# Patient Record
Sex: Female | Born: 1975 | Race: Black or African American | Hispanic: No | Marital: Married | Smoking: Former smoker
Health system: Southern US, Community
[De-identification: ages and names within clinical notes are randomized; demographics above are authoritative.]

## PROBLEM LIST (undated history)

## (undated) ENCOUNTER — Inpatient Hospital Stay (HOSPITAL_COMMUNITY): Payer: Self-pay

## (undated) DIAGNOSIS — E669 Obesity, unspecified: Secondary | ICD-10-CM

## (undated) DIAGNOSIS — M519 Unspecified thoracic, thoracolumbar and lumbosacral intervertebral disc disorder: Secondary | ICD-10-CM

## (undated) DIAGNOSIS — M1990 Unspecified osteoarthritis, unspecified site: Secondary | ICD-10-CM

## (undated) DIAGNOSIS — R569 Unspecified convulsions: Secondary | ICD-10-CM

## (undated) DIAGNOSIS — I10 Essential (primary) hypertension: Secondary | ICD-10-CM

## (undated) DIAGNOSIS — I480 Paroxysmal atrial fibrillation: Secondary | ICD-10-CM

## (undated) DIAGNOSIS — D219 Benign neoplasm of connective and other soft tissue, unspecified: Secondary | ICD-10-CM

## (undated) DIAGNOSIS — Z87442 Personal history of urinary calculi: Secondary | ICD-10-CM

## (undated) HISTORY — PX: TUBAL LIGATION: SHX77

---

## 2010-01-14 HISTORY — PX: CERVICAL LAMINECTOMY: SHX94

## 2011-10-03 ENCOUNTER — Emergency Department (HOSPITAL_COMMUNITY): Payer: Self-pay

## 2011-10-03 ENCOUNTER — Emergency Department (HOSPITAL_COMMUNITY)
Admission: EM | Admit: 2011-10-03 | Discharge: 2011-10-03 | Disposition: A | Discharge status: Home / Self Care | Payer: Self-pay | Attending: Emergency Medicine | Admitting: Emergency Medicine

## 2011-10-03 DIAGNOSIS — I10 Essential (primary) hypertension: Secondary | ICD-10-CM | POA: Insufficient documentation

## 2011-10-03 DIAGNOSIS — Z79899 Other long term (current) drug therapy: Secondary | ICD-10-CM | POA: Insufficient documentation

## 2011-10-03 DIAGNOSIS — D259 Leiomyoma of uterus, unspecified: Secondary | ICD-10-CM | POA: Insufficient documentation

## 2011-10-03 LAB — URINALYSIS, ROUTINE W REFLEX MICROSCOPIC
Glucose, UA: NEGATIVE mg/dL
Protein, ur: NEGATIVE mg/dL
Urobilinogen, UA: 1 mg/dL (ref 0.0–1.0)

## 2011-10-03 LAB — WET PREP, GENITAL
Trich, Wet Prep: NONE SEEN
WBC, Wet Prep HPF POC: NONE SEEN
Yeast Wet Prep HPF POC: NONE SEEN

## 2011-10-05 LAB — GC/CHLAMYDIA PROBE AMP, GENITAL
Chlamydia, DNA Probe: NEGATIVE
GC Probe Amp, Genital: NEGATIVE

## 2011-11-24 ENCOUNTER — Emergency Department (HOSPITAL_COMMUNITY): Payer: Medicaid Other

## 2011-11-24 ENCOUNTER — Emergency Department (HOSPITAL_COMMUNITY)
Admission: EM | Admit: 2011-11-24 | Discharge: 2011-11-24 | Disposition: A | Discharge status: Home / Self Care | Payer: Medicaid Other | Attending: Emergency Medicine | Admitting: Emergency Medicine

## 2011-11-24 DIAGNOSIS — I10 Essential (primary) hypertension: Secondary | ICD-10-CM | POA: Insufficient documentation

## 2011-11-24 DIAGNOSIS — R110 Nausea: Secondary | ICD-10-CM | POA: Insufficient documentation

## 2011-11-24 DIAGNOSIS — R1031 Right lower quadrant pain: Secondary | ICD-10-CM | POA: Insufficient documentation

## 2011-11-24 DIAGNOSIS — R630 Anorexia: Secondary | ICD-10-CM | POA: Insufficient documentation

## 2011-11-24 DIAGNOSIS — Z79899 Other long term (current) drug therapy: Secondary | ICD-10-CM | POA: Insufficient documentation

## 2011-11-24 HISTORY — DX: Essential (primary) hypertension: I10

## 2011-11-24 HISTORY — DX: Benign neoplasm of connective and other soft tissue, unspecified: D21.9

## 2011-11-24 LAB — CBC
HCT: 32.1 % — ABNORMAL LOW (ref 36.0–46.0)
MCH: 29.6 pg (ref 26.0–34.0)
MCHC: 36.1 g/dL — ABNORMAL HIGH (ref 30.0–36.0)
MCV: 81.9 fL (ref 78.0–100.0)
Platelets: 209 10*3/uL (ref 150–400)
RDW: 13 % (ref 11.5–15.5)

## 2011-11-24 LAB — COMPREHENSIVE METABOLIC PANEL
AST: 15 U/L (ref 0–37)
CO2: 26 mEq/L (ref 19–32)
Calcium: 8.7 mg/dL (ref 8.4–10.5)
Creatinine, Ser: 0.78 mg/dL (ref 0.50–1.10)
GFR calc non Af Amer: >90 mL/min (ref >90)
Total Protein: 7.9 g/dL (ref 6.0–8.3)

## 2011-11-24 LAB — DIFFERENTIAL
Basophils Absolute: 0 10*3/uL (ref 0.0–0.1)
Basophils Relative: 0 % (ref 0–1)
Eosinophils Absolute: 0.1 10*3/uL (ref 0.0–0.7)
Eosinophils Relative: 2 % (ref 0–5)
Lymphocytes Relative: 29 % (ref 12–46)
Monocytes Absolute: 0.3 10*3/uL (ref 0.1–1.0)

## 2011-11-24 LAB — URINALYSIS, ROUTINE W REFLEX MICROSCOPIC
Bilirubin Urine: NEGATIVE
Hgb urine dipstick: NEGATIVE
Ketones, ur: NEGATIVE mg/dL
Nitrite: NEGATIVE
Protein, ur: NEGATIVE mg/dL
Urobilinogen, UA: 1 mg/dL (ref 0.0–1.0)

## 2011-11-24 MED ORDER — HYDROMORPHONE HCL PF 1 MG/ML IJ SOLN
0.5000 mg | Freq: Once | INTRAMUSCULAR | Status: AC
  Administered 2011-11-24: 09:00:00 via INTRAVENOUS
  Filled 2011-11-24: qty 1

## 2011-11-24 MED ORDER — SODIUM CHLORIDE 0.9 % IV SOLN
Freq: Once | INTRAVENOUS | Status: AC
  Administered 2011-11-24: 09:00:00 via INTRAVENOUS

## 2011-11-24 MED ORDER — IOHEXOL 300 MG/ML  SOLN
20.0000 mL | INTRAMUSCULAR | Status: AC
Start: 2011-11-24 — End: 2011-11-24

## 2011-11-24 MED ORDER — ONDANSETRON HCL 4 MG/2ML IJ SOLN
4.0000 mg | Freq: Once | INTRAMUSCULAR | Status: AC
  Administered 2011-11-24: 4 mg via INTRAVENOUS
  Filled 2011-11-24: qty 2

## 2011-11-24 MED ORDER — OXYCODONE-ACETAMINOPHEN 5-325 MG PO TABS
1.0000 | ORAL_TABLET | Freq: Once | ORAL | Status: AC
  Administered 2011-11-24: 1 via ORAL
  Filled 2011-11-24: qty 1

## 2011-11-24 MED ORDER — HYDROMORPHONE HCL PF 1 MG/ML IJ SOLN
0.5000 mg | Freq: Once | INTRAMUSCULAR | Status: AC
  Administered 2011-11-24: 0.5 mg via INTRAVENOUS
  Filled 2011-11-24: qty 1

## 2011-11-24 MED ORDER — HYDROCODONE-ACETAMINOPHEN 5-325 MG PO TABS: 1.0000 | ORAL_TABLET | ORAL | Status: AC | PRN

## 2011-11-24 MED ORDER — IOHEXOL 300 MG/ML  SOLN: 90.0000 mL | Freq: Once | INTRAMUSCULAR | Status: DC | PRN

## 2011-11-24 MED ORDER — METOCLOPRAMIDE HCL 10 MG PO TABS: 10.0000 mg | ORAL_TABLET | Freq: Four times a day (QID) | ORAL | Status: AC

## 2011-11-24 MED ORDER — HYDROMORPHONE HCL PF 1 MG/ML IJ SOLN
0.5000 mg | Freq: Once | INTRAMUSCULAR | Status: AC
  Administered 2011-11-24: 11:00:00 via INTRAVENOUS
  Filled 2011-11-24: qty 1

## 2011-11-24 NOTE — ED Notes (Signed)
Pt presents with onset of RLQ abdominal pain that began last night.  Pt reports pain radiates to R flank.   Pt denies any dysuria, nausea, vomiting, diarrhea or vaginal discharge.

## 2011-11-24 NOTE — ED Provider Notes (Signed)
4:28 PM I discussed CT scan results with the patient. She has been instructed to take pain and nausea medication as needed and return to the emergency department if she develops high fever, severe pain or uncontrolled vomiting. She is also to followup with GYN for evaluation of fibroids as well as obtain a primary care physician for long-term health management.  Elwyn Reach [place], [place] 11/24/11 [phone]

## 2011-11-24 NOTE — ED Notes (Signed)
Pt to CT

## 2011-11-24 NOTE — ED Notes (Signed)
Assisted pt to BR to void. PT denies pain at this time. Awaiting ct

## 2011-11-24 NOTE — ED Provider Notes (Signed)
History     CSN: [id number] Arrival date & time: 11/24/2011  8:02 AM   First MD Initiated Contact with Patient 11/24/11 [phone]      Chief Complaint  Patient presents with  . Abdominal Pain    (Consider location/radiation/quality/duration/timing/severity/associated sxs/prior treatment) Patient is a 35 y.o. female presenting with abdominal pain. The history is provided by the patient.  Abdominal Pain The primary symptoms of the illness include abdominal pain and nausea. The primary symptoms of the illness do not include fever, vomiting, dysuria or vaginal discharge. The current episode started 6 to 12 hours ago. The onset of the illness was gradual.  The abdominal pain is located in the RLQ. The abdominal pain radiates to the right flank. The abdominal pain is relieved by nothing.  The patient has not had a change in bowel habit. Additional symptoms associated with the illness include anorexia. Symptoms associated with the illness do not include chills.    Past Medical History  Diagnosis Date  . Fibroid   . Kidney stone   . Hypertension     Past Surgical History  Procedure Date  . Back surgery     No family history on file.  History  Substance Use Topics  . Smoking status: Current Everyday Smoker -- 1.0 packs/day  . Smokeless tobacco: Not on file  . Alcohol Use: No    OB History    Grav Para Term Preterm Abortions TAB SAB Ect Mult Living                  Review of Systems  Constitutional: Negative for fever and chills.  HENT: Negative.   Respiratory: Negative.   Cardiovascular: Negative.   Gastrointestinal: Positive for nausea, abdominal pain and anorexia. Negative for vomiting.  Genitourinary: Negative for dysuria and vaginal discharge.  Musculoskeletal: Negative.   Skin: Negative.   Neurological: Negative.     Allergies  Review of patient's allergies indicates no known allergies.  Home Medications   Current Outpatient Rx  Name Route Sig Dispense Refill   . IBUPROFEN 200 MG PO TABS Oral Take 600 mg by mouth every 6 (six) hours as needed. For pain     . LISINOPRIL-HYDROCHLOROTHIAZIDE 20-12.5 MG PO TABS Oral Take 1 tablet by mouth daily.        BP 123/77  Pulse 76  Temp(Src) 98.8 F (37.1 C) (Oral)  Resp 20  SpO2 99%  LMP 11/03/2011  Physical Exam  Constitutional: She appears well-developed and well-nourished.  HENT:  Head: Normocephalic.  Neck: Normal range of motion. Neck supple.  Cardiovascular: Normal rate and regular rhythm.   Pulmonary/Chest: Effort normal and breath sounds normal.  Abdominal: Soft. Bowel sounds are normal. There is no rebound and no guarding.    Musculoskeletal: Normal range of motion.  Neurological: She is alert. No cranial nerve deficit.  Skin: Skin is warm and dry. No rash noted.  Psychiatric: She has a normal mood and affect.    ED Course  Procedures (including critical care time)  Labs Reviewed  CBC - Abnormal; Notable for the following:    WBC 3.1 (*)    Hemoglobin 11.6 (*)    HCT 32.1 (*)    MCHC 36.1 (*)    All other components within normal limits  COMPREHENSIVE METABOLIC PANEL - Abnormal; Notable for the following:    Potassium 3.4 (*)    Glucose, Bld 101 (*)    Albumin 3.3 (*)    All other components within normal limits  URINALYSIS,  ROUTINE W REFLEX MICROSCOPIC  POCT PREGNANCY, URINE  DIFFERENTIAL  POCT PREGNANCY, URINE  PREGNANCY, URINE   No results found.   No diagnosis found.    MDM  The patient and her husband are concerned given the location of the abdominal pain. We discussed with radiation concerns with a second CT scan in close proximity and decision was made to go ahead with CT to r/o appy.        Rodena Medin, PA 11/24/11 [phone]

## 2011-11-24 NOTE — ED Provider Notes (Signed)
Medical screening examination/treatment/procedure(s) were performed by non-physician practitioner and as supervising physician I was immediately available for consultation/collaboration.   Dayson Aboud L Krayton Wortley, MD 11/24/11 1611 

## 2011-11-26 NOTE — ED Provider Notes (Signed)
Medical screening examination/treatment/procedure(s) were performed by non-physician practitioner and as supervising physician I was immediately available for consultation/collaboration.   Montarius Kitagawa L Jessice Madill, MD 11/26/11 0718 

## 2012-01-29 ENCOUNTER — Other Ambulatory Visit: Payer: Self-pay

## 2012-01-29 ENCOUNTER — Encounter (HOSPITAL_COMMUNITY): Payer: Self-pay | Admitting: Emergency Medicine

## 2012-01-29 ENCOUNTER — Emergency Department (HOSPITAL_COMMUNITY)
Admission: EM | Admit: 2012-01-29 | Discharge: 2012-01-29 | Disposition: A | Discharge status: Home / Self Care | Payer: Medicaid Other | Attending: Emergency Medicine | Admitting: Emergency Medicine

## 2012-01-29 ENCOUNTER — Emergency Department (HOSPITAL_COMMUNITY): Payer: Medicaid Other

## 2012-01-29 DIAGNOSIS — Z3201 Encounter for pregnancy test, result positive: Secondary | ICD-10-CM | POA: Insufficient documentation

## 2012-01-29 DIAGNOSIS — R109 Unspecified abdominal pain: Secondary | ICD-10-CM | POA: Insufficient documentation

## 2012-01-29 DIAGNOSIS — F17200 Nicotine dependence, unspecified, uncomplicated: Secondary | ICD-10-CM | POA: Insufficient documentation

## 2012-01-29 DIAGNOSIS — J40 Bronchitis, not specified as acute or chronic: Secondary | ICD-10-CM

## 2012-01-29 DIAGNOSIS — N898 Other specified noninflammatory disorders of vagina: Secondary | ICD-10-CM | POA: Insufficient documentation

## 2012-01-29 DIAGNOSIS — R05 Cough: Secondary | ICD-10-CM | POA: Insufficient documentation

## 2012-01-29 DIAGNOSIS — R112 Nausea with vomiting, unspecified: Secondary | ICD-10-CM | POA: Insufficient documentation

## 2012-01-29 DIAGNOSIS — R059 Cough, unspecified: Secondary | ICD-10-CM | POA: Insufficient documentation

## 2012-01-29 DIAGNOSIS — R062 Wheezing: Secondary | ICD-10-CM | POA: Insufficient documentation

## 2012-01-29 LAB — POCT I-STAT, CHEM 8
BUN: 8 mg/dL (ref 6–23)
Calcium, Ion: 1.19 mmol/L (ref 1.12–1.32)
Chloride: 107 meq/L (ref 96–112)
Creatinine, Ser: 0.8 mg/dL (ref 0.50–1.10)
Glucose, Bld: 102 mg/dL — ABNORMAL HIGH (ref 70–99)
HCT: 35 % — ABNORMAL LOW (ref 36.0–46.0)
Hemoglobin: 11.9 g/dL — ABNORMAL LOW (ref 12.0–15.0)
Potassium: 3.6 meq/L (ref 3.5–5.1)
Sodium: 142 meq/L (ref 135–145)
TCO2: 23 mmol/L (ref 0–100)

## 2012-01-29 LAB — URINALYSIS, ROUTINE W REFLEX MICROSCOPIC
Bilirubin Urine: NEGATIVE
Glucose, UA: NEGATIVE mg/dL
Hgb urine dipstick: NEGATIVE
Ketones, ur: 15 mg/dL — AB
Leukocytes, UA: NEGATIVE
Nitrite: NEGATIVE
Protein, ur: NEGATIVE mg/dL
Specific Gravity, Urine: 1.022 (ref 1.005–1.030)
Urobilinogen, UA: 1 mg/dL (ref 0.0–1.0)
pH: 7.5 (ref 5.0–8.0)

## 2012-01-29 LAB — WET PREP, GENITAL
Trich, Wet Prep: NONE SEEN
Yeast Wet Prep HPF POC: NONE SEEN

## 2012-01-29 LAB — CBC
HCT: 31.9 % — ABNORMAL LOW (ref 36.0–46.0)
Hemoglobin: 11.5 g/dL — ABNORMAL LOW (ref 12.0–15.0)
MCH: 29.3 pg (ref 26.0–34.0)
MCHC: 36.1 g/dL — ABNORMAL HIGH (ref 30.0–36.0)
RDW: 12.5 % (ref 11.5–15.5)

## 2012-01-29 LAB — DIFFERENTIAL
Basophils Absolute: 0 K/uL (ref 0.0–0.1)
Basophils Relative: 1 % (ref 0–1)
Eosinophils Absolute: 0.1 K/uL (ref 0.0–0.7)
Eosinophils Relative: 4 % (ref 0–5)
Lymphocytes Relative: 29 % (ref 12–46)
Lymphs Abs: 1 K/uL (ref 0.7–4.0)
Monocytes Absolute: 0.4 K/uL (ref 0.1–1.0)
Monocytes Relative: 11 % (ref 3–12)
Neutro Abs: 1.9 K/uL (ref 1.7–7.7)
Neutrophils Relative %: 56 % (ref 43–77)

## 2012-01-29 LAB — HCG, QUANTITATIVE, PREGNANCY: hCG, Beta Chain, Quant, S: 622 m[IU]/mL — ABNORMAL HIGH

## 2012-01-29 LAB — POCT PREGNANCY, URINE: Preg Test, Ur: POSITIVE — AB

## 2012-01-29 MED ORDER — ALBUTEROL SULFATE HFA 108 (90 BASE) MCG/ACT IN AERS
2.0000 | INHALATION_SPRAY | Freq: Once | RESPIRATORY_TRACT | Status: AC
  Administered 2012-01-29: 2 via RESPIRATORY_TRACT
  Filled 2012-01-29: qty 6.7

## 2012-01-29 MED ORDER — ACETAMINOPHEN 325 MG PO TABS
650.0000 mg | ORAL_TABLET | Freq: Once | ORAL | Status: AC
  Administered 2012-01-29: 650 mg via ORAL
  Filled 2012-01-29: qty 2

## 2012-01-29 MED ORDER — IPRATROPIUM BROMIDE 0.02 % IN SOLN
0.5000 mg | Freq: Once | RESPIRATORY_TRACT | Status: AC
  Administered 2012-01-29: 0.5 mg via RESPIRATORY_TRACT
  Filled 2012-01-29: qty 2.5

## 2012-01-29 MED ORDER — ALBUTEROL SULFATE (5 MG/ML) 0.5% IN NEBU
5.0000 mg | INHALATION_SOLUTION | Freq: Once | RESPIRATORY_TRACT | Status: AC
  Administered 2012-01-29: 5 mg via RESPIRATORY_TRACT
  Filled 2012-01-29: qty 1

## 2012-01-29 NOTE — ED Provider Notes (Signed)
Medical screening examination/treatment/procedure(s) were performed by non-physician practitioner and as supervising physician I was immediately available for consultation/collaboration.  Raeford Razor, MD 01/29/12 [phone]

## 2012-01-29 NOTE — ED Notes (Signed)
Pt still in radiology, family given snack and drink, deny other needs.

## 2012-01-29 NOTE — ED Notes (Signed)
abd pain and back pain ? preg  Had a d/c last night

## 2012-01-29 NOTE — Discharge Instructions (Signed)
Your pregnancy test is positive, your blood pregnancy levels suggest about 4wks. Start prenatal vitamins, tylenol for pain. Take albuterol inhaler 2 puffs every 4 hrs for cough. Drink plenty of fluids. It is important that if you continue to have pain and discharge, that you see either your OB/GYN, as referred, or [facility] 10 [facility]. [facility] to [facility] immediately if increased pain, heavy vaginal bleeding.   Abdominal Pain During Pregnancy Abdominal discomfort is common in pregnancy. Most of the time, it does not cause harm. There are many causes of abdominal pain. Some causes are more serious than others. Some of the causes of abdominal pain in pregnancy are easily diagnosed. Occasionally, the diagnosis takes time to understand. Other times, the cause is not determined. Abdominal pain can be a sign that something is very wrong with the pregnancy, or the pain may have nothing to do with the pregnancy at all. For this reason, always tell your caregiver if you have any abdominal discomfort. CAUSES Common and harmless causes of abdominal pain include:  Constipation.   Excess gas and bloating.   Round ligament pain. This is pain that is felt in the folds of the groin.   The position the baby or placenta is in.   Baby kicks.   Braxton-Hicks contractions. These are mild contractions that do not cause cervical dilation.  Serious causes of abdominal pain include:  Ectopic pregnancy. This happens when a fertilized egg implants outside of the uterus.   Miscarriage.   Preterm labor. This is when labor starts at less than 37 weeks of pregnancy.   Placental abruption. This is when the placenta partially or completely separates from the uterus.   Preeclampsia. This is often associated with high blood pressure and has been referred to as "toxemia in pregnancy."   Uterine or amniotic fluid infections.  Causes unrelated to pregnancy include:  Urinary tract  infection.   Gallbladder stones or inflammation.   Hepatitis or other liver illness.   Intestinal problems, stomach flu, food poisoning, or ulcer.   Appendicitis.   Kidney (renal) stones.   Kidney infection (pylonephritis).  HOME CARE INSTRUCTIONS  For mild pain:  Do not have sexual intercourse or put anything in your vagina until your symptoms go away completely.   Get plenty of rest until your pain improves. If your pain does not improve in 1 hour, call your caregiver.   Drink clear fluids if you feel nauseous. Avoid solid food as long as you are uncomfortable or nauseous.   Only take medicine as directed by your caregiver.   Keep all follow-up appointments with your caregiver.  SEEK IMMEDIATE MEDICAL CARE IF:  You are bleeding, leaking fluid, or passing tissue from the vagina.   You have increasing pain or cramping.   You have persistent vomiting.   You have painful or bloody urination.   You have a fever.   You notice a decrease in your baby's movements.   You have extreme weakness or feel faint.   You have shortness of breath, with or without abdominal pain.   You develop a severe headache with abdominal pain.   You have abnormal vaginal discharge with abdominal pain.   You have persistent diarrhea.   You have abdominal pain that continues even after rest, or gets worse.  MAKE SURE YOU:   Understand these instructions.   Will watch your condition.   Will get help right away if you are not doing well or get  worse.  Document Released: 11/30/2005 Document Revised: 08/12/2011 Document Reviewed: 06/26/2011 [facility] Patient Information 2012 [place], [place].

## 2012-01-29 NOTE — ED Provider Notes (Signed)
History     CSN: [id number]  Arrival date & time 01/29/12  0904   First MD Initiated Contact with Patient 01/29/12 [phone]      Chief Complaint  Patient presents with  . Abdominal Pain    (Consider location/radiation/quality/duration/timing/severity/associated sxs/prior treatment) Patient is a 36 y.o. female presenting with abdominal pain. The history is provided by the patient.  Abdominal Pain The primary symptoms of the illness include abdominal pain, fatigue, shortness of breath, nausea and vaginal discharge. The primary symptoms of the illness do not include fever, vomiting, diarrhea, dysuria or vaginal bleeding. The current episode started 3 to 5 hours ago. The onset of the illness was sudden. The problem has been gradually worsening.  The vaginal discharge is not associated with dysuria.   Additional symptoms associated with the illness include diaphoresis. Symptoms associated with the illness do not include chills.  Pt states yesterday had brown vaginal discharge, denies bleeding. This morning developed lower abdominal cramping. States feels similar to previous abdominal pains when was diagnosed with fibroids. Pt did not take any medications. Also complaining of cough for 3 days, taking mucinex with no relief. Today shortness of breath, sweating. Denies fever, chills, chest pain. No headache, neck stiffness. No urinary symptoms.   Past Medical History  Diagnosis Date  . Fibroid   . Kidney stone   . Hypertension     Past Surgical History  Procedure Date  . Back surgery     History reviewed. No pertinent family history.  History  Substance Use Topics  . Smoking status: Current Everyday Smoker -- 1.0 packs/day  . Smokeless tobacco: Not on file  . Alcohol Use: No    OB History    Grav Para Term Preterm Abortions TAB SAB Ect Mult Living                  Review of Systems  Constitutional: Positive for diaphoresis and fatigue. Negative for fever and chills.  HENT:  Negative.   Eyes: Negative.   Respiratory: Positive for cough, shortness of breath and wheezing. Negative for chest tightness.   Cardiovascular: Negative for chest pain.  Gastrointestinal: Positive for nausea and abdominal pain. Negative for vomiting and diarrhea.  Genitourinary: Positive for vaginal discharge. Negative for dysuria and vaginal bleeding.  Musculoskeletal: Negative.   Skin: Negative.   Neurological: Negative.   Psychiatric/Behavioral: Negative.     Allergies  Review of patient's allergies indicates no known allergies.  Home Medications   Current Outpatient Rx  Name Route Sig Dispense Refill  . LISINOPRIL-HYDROCHLOROTHIAZIDE 20-12.5 MG PO TABS Oral Take 1 tablet by mouth daily.      . IBUPROFEN 200 MG PO TABS Oral Take 600 mg by mouth every 6 (six) hours as needed. For pain       BP 148/89  Pulse 95  Temp(Src) 98.3 F (36.8 C) (Oral)  Resp 20  SpO2 99%  Physical Exam  Nursing note and vitals reviewed. Constitutional: She is oriented to person, place, and time. She appears well-developed and well-nourished.  HENT:  Head: Normocephalic.  Eyes: Conjunctivae are normal.  Neck: Neck supple.  Cardiovascular: Normal rate, regular rhythm and normal heart sounds.   Pulmonary/Chest: No respiratory distress. She has wheezes.       Tachypnec, Expiratory wheezes bilaterally  Abdominal: Soft. Bowel sounds are normal. There is tenderness.       Suprapubic tenderness, no guarding, no rebound tenderness  Genitourinary: Vagina normal. Uterus is enlarged. Cervix exhibits discharge. Cervix exhibits no motion tenderness.  Thin white discharge. Cervix appears normal, closed. No CMT, no uterine or adnexal tenderness  Musculoskeletal: She exhibits no edema.  Neurological: She is alert and oriented to person, place, and time.  Skin: Skin is warm.       diaphoretic  Psychiatric: She has a normal mood and affect.    ED Course  Procedures (including critical care  time)  Urine pregnancy positive. Pt is spotting, abdominal pain. Will get pelvic done, labs, [place] to r/o ectopic. Also SOB, cough. Pt diaphoretic, states feels hot. Pt afebrile. Tachypnec. Will get ECG, CXR. Will try nebs, pt is a smoker, some wheezing heard on exam.    Date: 01/29/2012  Rate: 88  Rhythm: normal sinus rhythm  QRS Axis: normal  Intervals: normal  ST/T Wave abnormalities: normal  Conduction Disutrbances:none  Narrative Interpretation:   Old EKG Reviewed: none available  Results for orders placed during the hospital encounter of 01/29/12  URINALYSIS, ROUTINE W REFLEX MICROSCOPIC      Component Value Range   Color, Urine YELLOW  YELLOW    APPearance CLEAR  CLEAR    Specific Gravity, Urine 1.022  1.005 - 1.030    pH 7.5  5.0 - 8.0    Glucose, UA NEGATIVE  NEGATIVE (mg/dL)   Hgb urine dipstick NEGATIVE  NEGATIVE    Bilirubin Urine NEGATIVE  NEGATIVE    Ketones, ur 15 (*) NEGATIVE (mg/dL)   Protein, ur NEGATIVE  NEGATIVE (mg/dL)   Urobilinogen, UA 1.0  0.0 - 1.0 (mg/dL)   Nitrite NEGATIVE  NEGATIVE    Leukocytes, UA NEGATIVE  NEGATIVE   WET PREP, GENITAL      Component Value Range   Yeast Wet Prep HPF POC NONE SEEN  NONE SEEN    Trich, Wet Prep NONE SEEN  NONE SEEN    Clue Cells Wet Prep HPF POC NONE SEEN  NONE SEEN    WBC, Wet Prep HPF POC FEW (*) NONE SEEN   POCT PREGNANCY, URINE      Component Value Range   Preg Test, Ur POSITIVE (*) NEGATIVE   CBC      Component Value Range   WBC 3.4 (*) 4.0 - 10.5 (K/uL)   RBC 3.93  3.87 - 5.11 (MIL/uL)   Hemoglobin 11.5 (*) 12.0 - 15.0 (g/dL)   HCT 21.3 (*) 08.6 - 46.0 (%)   MCV 81.2  78.0 - 100.0 (fL)   MCH 29.3  26.0 - 34.0 (pg)   MCHC 36.1 (*) 30.0 - 36.0 (g/dL)   RDW 57.8  46.9 - 62.9 (%)   Platelets 221  150 - 400 (K/uL)  DIFFERENTIAL      Component Value Range   Neutrophils Relative 56  43 - 77 (%)   Neutro Abs 1.9  1.7 - 7.7 (K/uL)   Lymphocytes Relative 29  12 - 46 (%)   Lymphs Abs 1.0  0.7 - 4.0 (K/uL)    Monocytes Relative 11  3 - 12 (%)   Monocytes Absolute 0.4  0.1 - 1.0 (K/uL)   Eosinophils Relative 4  0 - 5 (%)   Eosinophils Absolute 0.1  0.0 - 0.7 (K/uL)   Basophils Relative 1  0 - 1 (%)   Basophils Absolute 0.0  0.0 - 0.1 (K/uL)  ABO/RH      Component Value Range   ABO/RH(D) B POS    HCG, QUANTITATIVE, PREGNANCY      Component Value Range   hCG, Beta Chain, Quant, S 622 (*) <5 (mIU/mL)  POCT I-STAT, CHEM 8      Component Value Range   Sodium 142  135 - 145 (mEq/L)   Potassium 3.6  3.5 - 5.1 (mEq/L)   Chloride 107  96 - 112 (mEq/L)   BUN 8  6 - 23 (mg/dL)   Creatinine, Ser 1.61  0.50 - 1.10 (mg/dL)   Glucose, Bld 096 (*) 70 - 99 (mg/dL)   Calcium, Ion 0.45  4.09 - 1.32 (mmol/L)   TCO2 23  0 - 100 (mmol/L)   Hemoglobin 11.9 (*) 12.0 - 15.0 (g/dL)   HCT 81.1 (*) 91.4 - 46.0 (%)   Dg Chest 1 View  01/29/2012  *RADIOLOGY REPORT*  Clinical Data: Fever.  CHEST - 1 VIEW  Comparison: None.  Findings: Heart and mediastinal contours are within normal limits. No focal opacities or effusions.  No acute bony abnormality.  IMPRESSION: No active disease.  Original Report Authenticated By: Cyndie Chime, M.D.   US Ob Comp Less 14 Wks  01/29/2012  *RADIOLOGY REPORT*  Clinical Data: Ectopic pregnancy.  Spotting.  Pelvic pain. Gestational age by last menstrual period 3 weeks 3 days. Quantitative beta HCG 622.  OBSTETRIC <14 WK [place] AND TRANSVAGINAL OB US  Technique:  Both transabdominal and transvaginal ultrasound examinations were performed for complete evaluation of the gestation as well as the maternal uterus, adnexal regions, and pelvic cul-de-sac.  Transvaginal technique was performed to assess early pregnancy.  Comparison:  10/03/2011.  Intrauterine gestational sac:  Not visualized. Yolk sac: Not present Embryo: Not present Cardiac Activity: Not present Heart Rate: Not applicable bpm  Maternal uterus/adnexae: There is a hypoechoic area in the anterior uterine fundus compatible with fibroid  measuring 12 mm.  Posterior uterine fundal hypoechoic region also is compatible with fibroids, measuring 24 mm x 19 mm x 24 mm.  Both ovaries appear within normal limits with normal color flow. Doppler wave forms were not ordered were performed.  There is no color flow evidence of ovarian torsion.  12 mm follicle or cyst is present within the right ovary.  No adnexal mass is identified.  No free fluid is present in the anatomic pelvis.  IMPRESSION: 1.  No intrauterine pregnancy identified.  With quantitative beta HCG of 622, the differential considerations include early normal or abnormal IUP; ectopic pregnancy is not excluded.  There is no hemoperitoneum or adnexal mass. 2.  Consider follow-up examination in 10 days.  Original Report Authenticated By: Andreas Newport, M.D.   US Ob Transvaginal  01/29/2012  *RADIOLOGY REPORT*  Clinical Data: Ectopic pregnancy.  Spotting.  Pelvic pain. Gestational age by last menstrual period 3 weeks 3 days. Quantitative beta HCG 622.  OBSTETRIC <14 WK [place] AND TRANSVAGINAL OB US  Technique:  Both transabdominal and transvaginal ultrasound examinations were performed for complete evaluation of the gestation as well as the maternal uterus, adnexal regions, and pelvic cul-de-sac.  Transvaginal technique was performed to assess early pregnancy.  Comparison:  10/03/2011.  Intrauterine gestational sac:  Not visualized. Yolk sac: Not present Embryo: Not present Cardiac Activity: Not present Heart Rate: Not applicable bpm  Maternal uterus/adnexae: There is a hypoechoic area in the anterior uterine fundus compatible with fibroid measuring 12 mm.  Posterior uterine fundal hypoechoic region also is compatible with fibroids, measuring 24 mm x 19 mm x 24 mm.  Both ovaries appear within normal limits with normal color flow. Doppler wave forms were not ordered were performed.  There is no color flow evidence of ovarian torsion.  12 mm  follicle or cyst is present within the right ovary.  No adnexal  mass is identified.  No free fluid is present in the anatomic pelvis.  IMPRESSION: 1.  No intrauterine pregnancy identified.  With quantitative beta HCG of 622, the differential considerations include early normal or abnormal IUP; ectopic pregnancy is not excluded.  There is no hemoperitoneum or adnexal mass. 2.  Consider follow-up examination in 10 days.  Original Report Authenticated By: Andreas Newport, M.D.    No visualized pregnancy on [place]. May be to early given HCG of 622. Will have pt follow  Up in 10 days for recheck with GYN or sooner if bleeding worsens, develop increased pain. Pt's CXR is negative. Albuterol given for wheezing, instructed to stop smoking. Will start on tylenol for pain, prenatal vitamins.  No diagnosis found.    MDM          Lottie Mussel, PA 01/29/12 1436

## 2012-01-30 LAB — GC/CHLAMYDIA PROBE AMP, GENITAL: Chlamydia, DNA Probe: NEGATIVE

## 2012-03-08 ENCOUNTER — Other Ambulatory Visit (HOSPITAL_COMMUNITY): Payer: Self-pay | Admitting: Obstetrics

## 2012-03-08 DIAGNOSIS — Z3490 Encounter for supervision of normal pregnancy, unspecified, unspecified trimester: Secondary | ICD-10-CM

## 2012-03-09 ENCOUNTER — Encounter (HOSPITAL_COMMUNITY): Payer: Self-pay | Admitting: *Deleted

## 2012-03-09 ENCOUNTER — Inpatient Hospital Stay (HOSPITAL_COMMUNITY)
Admission: AD | Admit: 2012-03-09 | Discharge: 2012-03-09 | Disposition: A | Discharge status: Home / Self Care | Payer: Medicaid Other | Source: Ambulatory Visit | Attending: Obstetrics | Admitting: Obstetrics

## 2012-03-09 DIAGNOSIS — O2390 Unspecified genitourinary tract infection in pregnancy, unspecified trimester: Secondary | ICD-10-CM | POA: Insufficient documentation

## 2012-03-09 DIAGNOSIS — Z331 Pregnant state, incidental: Secondary | ICD-10-CM

## 2012-03-09 DIAGNOSIS — L293 Anogenital pruritus, unspecified: Secondary | ICD-10-CM | POA: Insufficient documentation

## 2012-03-09 DIAGNOSIS — B3731 Acute candidiasis of vulva and vagina: Secondary | ICD-10-CM | POA: Insufficient documentation

## 2012-03-09 DIAGNOSIS — B373 Candidiasis of vulva and vagina: Secondary | ICD-10-CM

## 2012-03-09 LAB — URINALYSIS, ROUTINE W REFLEX MICROSCOPIC
Bilirubin Urine: NEGATIVE
Glucose, UA: NEGATIVE mg/dL
Hgb urine dipstick: NEGATIVE
Ketones, ur: NEGATIVE mg/dL
Protein, ur: NEGATIVE mg/dL

## 2012-03-09 LAB — URINE MICROSCOPIC-ADD ON

## 2012-03-09 LAB — WET PREP, GENITAL
Clue Cells Wet Prep HPF POC: NONE SEEN
Trich, Wet Prep: NONE SEEN

## 2012-03-09 MED ORDER — FLUCONAZOLE 150 MG PO TABS
150.0000 mg | ORAL_TABLET | Freq: Once | ORAL | Status: AC
  Administered 2012-03-09: 150 mg via ORAL
  Filled 2012-03-09: qty 1

## 2012-03-09 NOTE — [facility] Provider Note (Signed)
History     CSN: [id number]  Arrival date & time 03/09/12  1735   None     Chief Complaint  Patient presents with  . Vaginal Itching    HPI Darlene Black is a 36 y.o. female @ [redacted]w[redacted]d gestation who presents to [facility] for vaginal itching. Onset of itching this afternoon with vaginal discharge. Denies cramping or bleeding. The history was provided by the patient.  Past Medical History  Diagnosis Date  . Fibroid   . Kidney stone   . Hypertension     Past Surgical History  Procedure Date  . Spine surgery     Family History  Problem Relation Age of Onset  . Anesthesia problems Neg Hx     History  Substance Use Topics  . Smoking status: Former Smoker -- 1.0 packs/day  . Smokeless tobacco: Not on file  . Alcohol Use: No    OB History    Grav Para Term Preterm Abortions TAB SAB Ect Mult Living   2 1 1  0 0 0 0 0 0 1      Review of Systems  Constitutional: Positive for fatigue. Negative for fever, chills and diaphoresis.  HENT: Negative for ear pain, congestion, sore throat, facial swelling, neck pain, neck stiffness, dental problem and sinus pressure.   Eyes: Negative for photophobia, pain and discharge.  Respiratory: Negative for cough, chest tightness and wheezing.   Cardiovascular: Negative.   Gastrointestinal: Positive for nausea. Negative for vomiting, abdominal pain, diarrhea, constipation and abdominal distention.  Genitourinary: Positive for frequency, vaginal discharge and vaginal pain (itching). Negative for dysuria, flank pain, vaginal bleeding, difficulty urinating and dyspareunia.  Musculoskeletal: Negative for myalgias, back pain and gait problem.  Skin: Negative for color change and rash.  Neurological: Negative for dizziness, speech difficulty, weakness, light-headedness, numbness and headaches.  Psychiatric/Behavioral: Negative for confusion and agitation. The patient is not nervous/anxious.     Allergies  Review of patient's allergies indicates no  known allergies.  Home Medications  No current outpatient prescriptions on file.  BP 111/65  Pulse 93  Temp(Src) 99.3 F (37.4 C) (Oral)  Resp 20  Ht 5' 2.5" (1.588 m)  Wt 245 lb 2 oz (111.188 kg)  BMI 44.12 kg/m2  LMP 01/05/2012  Physical Exam  Nursing note and vitals reviewed. Constitutional: She is oriented to person, place, and time. She appears well-developed and well-nourished. No distress.  HENT:  Head: Normocephalic.  Eyes: EOM are normal.  Neck: Neck supple.  Cardiovascular: Normal rate.   Pulmonary/Chest: Effort normal.  Abdominal: Soft. There is no tenderness.  Genitourinary:       External genitalia without lesions. Thick white cheesy discharge vaginal vault. Cervix long, closed, no CMT, no adnexal tenderness, Uterus approximately 10 week size.  Musculoskeletal: Normal range of motion.  Neurological: She is alert and oriented to person, place, and time. No cranial nerve deficit.  Skin: Skin is warm and dry.  Psychiatric: She has a normal mood and affect. Her behavior is normal. Judgment and thought content normal.    Assessment: Monilia vaginitis  Plan:  Diflucan 150 mg po now   Keep appointment as scheduled with Dr. Gaynell Face this week   Return here as needed. ED Course   Procedures: informal bedside ultrasound shows an active IUP with cardiac activity. Patient and family able to visualize heart beat.     MDM

## 2012-03-09 NOTE — Discharge Instructions (Signed)
  We have given you a pill for your yeast infection. Keep your appointment this week with Dr. Gaynell Face. Return here as needed.  Candidal Vulvovaginitis Candidal vulvovaginitis is an infection of the vagina and vulva. The vulva is the skin around the opening of the vagina. This may cause itching and discomfort in and around the vagina.  HOME CARE  Only take medicine as told by your doctor.   Do not have sex (intercourse) until the infection is healed or as told by your doctor.   Practice safe sex.   Tell your sex partner about your infection.   Do not douche or use tampons.   Wear cotton underwear. Do not wear tight pants or panty hose.   Eat yogurt. This may help treat and prevent yeast infections.  GET HELP RIGHT AWAY IF:   You have a fever.   Your problems get worse during treatment or do not get better in 3 days.   You have discomfort, irritation, or itching in your vagina or vulva area.   You have pain after sex.   You start to get belly (abdominal) pain.  MAKE SURE YOU:  Understand these instructions.   Will watch your condition.   Will get help right away if you are not doing well or get worse.  Document Released: 02/26/2009 Document Revised: 11/19/2011 Document Reviewed: 02/26/2009 [facility] Patient Information 2012 [place], [place].

## 2012-03-09 NOTE — MAU Note (Signed)
Pt in c/o vaginal itching that started around 1400, states she has a white, thick discharge.  Denies any pain or bleeding.

## 2012-03-09 NOTE — MAU Note (Signed)
Pt states, " I started having vaginal itching since 2 pm today and I haven't noticed any odor or discharge"

## 2012-03-10 LAB — GC/CHLAMYDIA PROBE AMP, GENITAL: GC Probe Amp, Genital: NEGATIVE

## 2012-03-11 ENCOUNTER — Ambulatory Visit (HOSPITAL_COMMUNITY): Payer: Self-pay

## 2012-03-11 ENCOUNTER — Ambulatory Visit (HOSPITAL_COMMUNITY)
Admission: RE | Admit: 2012-03-11 | Discharge: 2012-03-11 | Disposition: A | Discharge status: Home / Self Care | Payer: Self-pay | Source: Ambulatory Visit | Attending: Obstetrics | Admitting: Obstetrics

## 2012-03-11 DIAGNOSIS — O3410 Maternal care for benign tumor of corpus uteri, unspecified trimester: Secondary | ICD-10-CM | POA: Insufficient documentation

## 2012-03-11 DIAGNOSIS — O09529 Supervision of elderly multigravida, unspecified trimester: Secondary | ICD-10-CM | POA: Insufficient documentation

## 2012-03-11 DIAGNOSIS — Z3490 Encounter for supervision of normal pregnancy, unspecified, unspecified trimester: Secondary | ICD-10-CM

## 2012-03-11 DIAGNOSIS — O3680X0 Pregnancy with inconclusive fetal viability, not applicable or unspecified: Secondary | ICD-10-CM | POA: Insufficient documentation

## 2012-03-28 LAB — OB RESULTS CONSOLE ABO/RH

## 2012-03-28 LAB — OB RESULTS CONSOLE RUBELLA ANTIBODY, IGM: Rubella: IMMUNE

## 2012-03-28 LAB — OB RESULTS CONSOLE RPR: RPR: NONREACTIVE

## 2012-03-28 LAB — OB RESULTS CONSOLE HIV ANTIBODY (ROUTINE TESTING): HIV: NONREACTIVE

## 2012-04-21 ENCOUNTER — Inpatient Hospital Stay (HOSPITAL_COMMUNITY)
Admission: AD | Admit: 2012-04-21 | Discharge: 2012-04-21 | Disposition: A | Discharge status: Home / Self Care | Payer: Medicaid Other | Source: Ambulatory Visit | Attending: Obstetrics | Admitting: Obstetrics

## 2012-04-21 ENCOUNTER — Encounter (HOSPITAL_COMMUNITY): Payer: Self-pay | Admitting: *Deleted

## 2012-04-21 DIAGNOSIS — O99891 Other specified diseases and conditions complicating pregnancy: Secondary | ICD-10-CM | POA: Insufficient documentation

## 2012-04-21 DIAGNOSIS — S8010XA Contusion of unspecified lower leg, initial encounter: Secondary | ICD-10-CM

## 2012-04-21 DIAGNOSIS — M79609 Pain in unspecified limb: Secondary | ICD-10-CM | POA: Insufficient documentation

## 2012-04-21 DIAGNOSIS — S7010XA Contusion of unspecified thigh, initial encounter: Secondary | ICD-10-CM | POA: Insufficient documentation

## 2012-04-21 DIAGNOSIS — W19XXXA Unspecified fall, initial encounter: Secondary | ICD-10-CM | POA: Insufficient documentation

## 2012-04-21 MED ORDER — HYDROCODONE-ACETAMINOPHEN 5-325 MG PO TABS: 2.0000 | ORAL_TABLET | ORAL | Status: AC | PRN

## 2012-04-21 NOTE — Discharge Instructions (Signed)
Contusion A contusion is a deep bruise. Contusions happen when an injury causes bleeding under the skin. Signs of bruising include pain, puffiness (swelling), and discolored skin. The contusion may turn blue, purple, or yellow. HOME CARE   Put ice on the injured area.   Put ice in a plastic bag.   Place a towel between your skin and the bag.   Leave the ice on for 15 to 20 minutes, 3 to 4 times a day.   Only take medicine as told by your doctor.   Rest the injured area.   If possible, raise (elevate) the injured area to lessen puffiness.  GET HELP RIGHT AWAY IF:   You have more bruising or puffiness.   You have pain that is getting worse.   Your puffiness or pain is not helped by medicine.  MAKE SURE YOU:   Understand these instructions.   Will watch your condition.   Will get help right away if you are not doing well or get worse.  Document Released: 05/18/2008 Document Revised: 11/19/2011 Document Reviewed: 10/05/2011 [employer] Patient Information 2012 [employer].Contusion A contusion is a deep bruise. Contusions happen when an injury causes bleeding under the skin. Signs of bruising include pain, puffiness (swelling), and discolored skin. The contusion may turn blue, purple, or yellow. HOME CARE   Put ice on the injured area.   Put ice in a plastic bag.   Place a towel between your skin and the bag.   Leave the ice on for 15 to 20 minutes, 3 to 4 times a day.   Only take medicine as told by your doctor.   Rest the injured area.   If possible, raise (elevate) the injured area to lessen puffiness.  GET HELP RIGHT AWAY IF:   You have more bruising or puffiness.   You have pain that is getting worse.   Your puffiness or pain is not helped by medicine.  MAKE SURE YOU:   Understand these instructions.   Will watch your condition.   Will get help right away if you are not doing well or get worse.  Document Released: 05/18/2008 Document Revised:  11/19/2011 Document Reviewed: 10/05/2011 [employer] Patient Information 2012 [employer]. 

## 2012-04-21 NOTE — MAU Provider Note (Signed)
History     CSN: [id number]  Arrival date & time 04/21/12  2145   None     Chief Complaint  Patient presents with  . Fall   HPI Darlene Black is a 36 y.o. female @ [redacted]w[redacted]d gestation who presents to MAU for pain in left leg. She states she slipped and fell in the kitchen approximately 2 hours prior to arrival to MAU. She denies vaginal bleeding or leaking of fluid. No abdominal pain. The pain in the left leg is upper, posterior that radiates to the left buttock. She rates the pain 8/10. She denies other injuries. The history was provided by the patient.  Past Medical History  Diagnosis Date  . Fibroid   . Kidney stone   . Hypertension     Past Surgical History  Procedure Date  . Spine surgery     Family History  Problem Relation Age of Onset  . Anesthesia problems Neg Hx     History  Substance Use Topics  . Smoking status: Former Smoker -- 1.0 packs/day  . Smokeless tobacco: Not on file  . Alcohol Use: No    OB History    Grav Para Term Preterm Abortions TAB SAB Ect Mult Living   2 1 1  0 0 0 0 0 0 1      Review of Systems  Constitutional: Negative for fever, chills, diaphoresis and fatigue.  HENT: Negative for ear pain, congestion, sore throat, facial swelling, neck pain, neck stiffness, dental problem and sinus pressure.   Eyes: Negative for photophobia, pain, discharge and visual disturbance.  Respiratory: Negative for cough, chest tightness, shortness of breath and wheezing.   Cardiovascular: Negative for chest pain, palpitations and leg swelling.  Gastrointestinal: Negative for nausea, vomiting, abdominal pain, diarrhea, constipation and abdominal distention.  Genitourinary: Negative for dysuria, frequency, flank pain, vaginal bleeding, vaginal discharge, difficulty urinating, vaginal pain and pelvic pain.  Musculoskeletal: Negative for myalgias, back pain and gait problem.  Skin: Negative for color change and rash.  Neurological: Negative for dizziness, speech  difficulty, weakness, light-headedness, numbness and headaches.  Psychiatric/Behavioral: Negative for confusion and agitation. The patient is not nervous/anxious.     Allergies  Review of patient's allergies indicates no known allergies.  Home Medications  No current outpatient prescriptions on file.  BP 109/68  Pulse 97  Temp(Src) 98.3 F (36.8 C) (Oral)  Resp 20  Ht 5' 2.5" (1.588 m)  Wt 244 lb (110.678 kg)  BMI 43.92 kg/m2  SpO2 97%  LMP 01/05/2012  Physical Exam  Nursing note and vitals reviewed. Constitutional: She is oriented to person, place, and time. She appears well-developed and well-nourished.  HENT:  Head: Normocephalic and atraumatic.  Eyes: EOM are normal.  Neck: Normal range of motion. Neck supple.  Cardiovascular: Normal rate, regular rhythm and normal heart sounds.   Pulmonary/Chest: Effort normal and breath sounds normal.  Abdominal: Soft. There is no tenderness.       Positive FHT's.  Musculoskeletal: Normal range of motion.       Left upper leg, lateral aspect with tenderness on palpation. Tender left buttocks.  Neurological: She is alert and oriented to person, place, and time. She has normal strength. No cranial nerve deficit or sensory deficit. Coordination normal.  Skin: Skin is warm and dry.  Psychiatric: She has a normal mood and affect. Her behavior is normal. Judgment and thought content normal.   Assessment: Fall @ 16.[redacted] weeks gestation   Contusion to left upper leg  Plan:  Hydrocodone 5/325 mg. PO now and Rx to go home with paient   Follow up with Dr. Gaynell Face   Return here as needed.  ED Course  Procedures  MDM

## 2012-04-21 NOTE — MAU Note (Signed)
Pt reports she fell about 1.5 hours ago. Pt states she "slipped" and did a "split"states she hit the back of her left thigh on a pot. States it hurts to walk , no outward injury. Also hit her left forearm. Denies vaginal bleeding

## 2012-04-28 ENCOUNTER — Other Ambulatory Visit (HOSPITAL_COMMUNITY): Payer: Self-pay | Admitting: Obstetrics

## 2012-04-28 DIAGNOSIS — O09529 Supervision of elderly multigravida, unspecified trimester: Secondary | ICD-10-CM

## 2012-04-28 DIAGNOSIS — Z3689 Encounter for other specified antenatal screening: Secondary | ICD-10-CM

## 2012-05-06 ENCOUNTER — Other Ambulatory Visit (HOSPITAL_COMMUNITY): Payer: Self-pay | Admitting: Obstetrics

## 2012-05-06 ENCOUNTER — Other Ambulatory Visit: Payer: Self-pay

## 2012-05-06 ENCOUNTER — Encounter (HOSPITAL_COMMUNITY): Payer: Self-pay | Admitting: MS"

## 2012-05-06 ENCOUNTER — Ambulatory Visit (HOSPITAL_COMMUNITY)
Admission: RE | Admit: 2012-05-06 | Discharge: 2012-05-06 | Disposition: A | Discharge status: Home / Self Care | Payer: Medicaid Other | Source: Ambulatory Visit | Attending: Obstetrics | Admitting: Obstetrics

## 2012-05-06 DIAGNOSIS — O09529 Supervision of elderly multigravida, unspecified trimester: Secondary | ICD-10-CM

## 2012-05-06 DIAGNOSIS — Z3689 Encounter for other specified antenatal screening: Secondary | ICD-10-CM

## 2012-05-06 DIAGNOSIS — Z363 Encounter for antenatal screening for malformations: Secondary | ICD-10-CM | POA: Insufficient documentation

## 2012-05-06 DIAGNOSIS — O3410 Maternal care for benign tumor of corpus uteri, unspecified trimester: Secondary | ICD-10-CM | POA: Insufficient documentation

## 2012-05-06 DIAGNOSIS — Z1389 Encounter for screening for other disorder: Secondary | ICD-10-CM | POA: Insufficient documentation

## 2012-05-06 DIAGNOSIS — O358XX0 Maternal care for other (suspected) fetal abnormality and damage, not applicable or unspecified: Secondary | ICD-10-CM | POA: Insufficient documentation

## 2012-05-06 LAB — AP-AFP (ALPHA FETOPROTEIN)

## 2012-05-06 NOTE — Progress Notes (Signed)
Ms. Ditton was seen for ultrasound appointment today.  Please see [facility] report for details.

## 2012-05-06 NOTE — Progress Notes (Signed)
Genetic Counseling  High-Risk Gestation Note  Appointment Date:  05/06/2012 Referred By: Kathreen Cosier, MD Date of Birth:  Oct 11, 1976 Partner:  Lianne Cure    Pregnancy History: G2P1001 Estimated Date of Delivery: 10/05/12 Estimated Gestational Age: [redacted]w[redacted]d Attending: Rema Fendt, MD   Mrs. Mikeala Black and her husband, Mr. Darlene Black, were seen for genetic counseling because of a maternal age of 36 y.o.Darlene Black  They were accompanied by the patient's daughter.    They were counseled regarding maternal age and the association with risk for chromosome conditions due to nondisjunction with aging of the ova.   We reviewed chromosomes, nondisjunction, and the associated 1 in 111 risk for fetal aneuploidy related to a maternal age of 36 y.o. at [redacted]w[redacted]d gestation.  They were counseled that the risk for aneuploidy decreases as gestational age increases, accounting for those pregnancies which spontaneously abort.  We specifically discussed Down syndrome (trisomy 44), trisomies 93 and 32, and sex chromosome aneuploidies (47,XXX and 47,XXY) including the common features and prognoses of each.   We reviewed available screening and diagnostic options.  Regarding screening tests, we discussed the options of Quad screen and ultrasound.  They understand that screening tests are used to modify a patient's a priori risk for aneuploidy, typically based on age.  This estimate provides a pregnancy specific risk assessment. They were counseled that 50-80% of fetuses with Down syndrome and up to 90% of fetuses with trisomies 13 and 18, when well visualized, have detectable anomalies or soft markers by ultrasound. We discussed another type of screening test, noninvasive prenatal testing (NIPT), which utilizes cell free fetal DNA found in the maternal circulation. This test is not diagnostic for chromosome conditions, but can provide information regarding the presence or absence of extra fetal DNA for chromosomes 13,  18, 21, X and Y. Thus, it would not identify or rule out all fetal aneuploidy. The reported detection rate is greater than 99% for Trisomy 21, greater than 97% for Trisomy 18, and is approximately 80% (8 out of 10) for Trisomy 13. The false positive rate is reported to be less than 1% for any of these conditions.  We also reviewed the availability of diagnostic option of amniocentesis. A risk of 1 in 200-300 was given for amniocentesis, the primary concern being spontaneous pregnancy loss. We discussed the risks, limitations, and benefits of each.    After reviewing these options, Mrs. Darlene Black elected to have amniocentesis at the time of today's visit for fetal chromosome analysis, and declined cell free fetal DNA testing. Please see separate ultrasound report for detailed information. Final karyotype analysis will be available in approximately 8-10 days and results will be forwarded to your office.  She understands that ultrasound and amniocentesis cannot rule out all birth defects or genetic syndromes. Complete ultrasound performed at the time of today's visit. Ultrasound result reported separately.   Mrs. Darlene Black was provided with written information regarding sickle cell anemia (SCA) including the carrier frequency and incidence in the African-American population, the availability of carrier testing and prenatal diagnosis if indicated.  In addition, we discussed that hemoglobinopathies are routinely screened for as part of the [place] newborn screening panel.  We discussed that her OB medical records indicate that she had negative sickle cell trait testing, indicating that she does not have sickle cell trait. However, we do not have laboratory records indicating how this testing was performed. Hemoglobin electrophoresis is available, if desired to screen for additional less common hemoglobin variants.  Both family histories were reviewed and found to be contributory for mental retardation in  the patient's female maternal first cousin (her maternal aunt's son). She reported that he was mentally slower compared to other relatives and had no physical differences. She is unsure if he is living or deceased, but reported that he would be in his 4's if still living. He was one of 15 children for this aunt. An underlying cause is not known for his delays. We discussed that there are many different causes of mental retardation such as genetic differences, sporadic causes, or injuries.  A specific diagnosis for mental retardation can be determined in approximately 50% of cases.  In the remaining 50% of cases, a diagnosis may not ever be determined.  Without more specific information, potential genetic risks cannot be determined.  The patient was advised to call if she is able to find out more information regarding a specific diagnosis.   Additionally, the patient reported that her maternal half sister had a stillborn baby. An underlying cause was not known but the patient reported that her sister has issues with being able to carry babies. We discussed that additional information is needed regarding this history in order to assess whether or not there may be implications for relatives.  Without further information regarding the provided family history, an accurate genetic risk cannot be calculated. Further genetic counseling is warranted if more information is obtained.  Mrs. Darlene Black denied exposure to environmental toxins or chemical agents. She reported one dental xray on 04/15/12 and stated that her abdomen was shielded during the exam. The dose of ionizing radiation is an important factor in determining the potential toxicity to a pregnancy. Available study data suggest x ray exposure at 5 rad or less is not associated with an increased risk for birth defects. We discussed that the amount from the reported dental xray is expected to be below this threshold. She denied the use of alcohol, tobacco or  street drugs. She denied significant viral illnesses during the course of her pregnancy. Her medical and surgical histories were contributory for high blood pressure, for which she was taking lisinopril. She reported that she discontinued medication once discovering the pregnancy and is being monitored regularly by her OB.    I counseled this couple regarding the above risks and available options.  The approximate face-to-face time with the genetic counselor was 40 minutes.  Quinn Plowman, MS,  Certified Genetic Counselor 05/06/2012

## 2012-05-17 ENCOUNTER — Telehealth (HOSPITAL_COMMUNITY): Payer: Self-pay | Admitting: MS"

## 2012-05-17 NOTE — Telephone Encounter (Signed)
Attempted to call patient regarding final karyotype result from amniocentesis. Received automated message that the number is no longer in service. Attempted to call patient's husband, listed as emergency contact. Received automated message that the number is no longer in service. No additional phone numbers listed for patient. Unable to contact patient with results.   Darlene Black Darlene Black 05/17/2012 11:28 AM

## 2012-05-17 NOTE — Telephone Encounter (Signed)
Patient's primary OB, Dr. Gaynell Face, had phone number [phone] also listed for the patient. Attempted to call this number to inform patient of amniocentesis results. This number also not in service.   Clydie Braun Kenya Kook 05/17/2012 11:35 AM

## 2012-05-31 ENCOUNTER — Other Ambulatory Visit (HOSPITAL_COMMUNITY): Payer: Self-pay | Admitting: Obstetrics

## 2012-05-31 DIAGNOSIS — O09529 Supervision of elderly multigravida, unspecified trimester: Secondary | ICD-10-CM

## 2012-06-03 ENCOUNTER — Ambulatory Visit (HOSPITAL_COMMUNITY): Payer: Medicaid Other

## 2012-06-07 ENCOUNTER — Ambulatory Visit (HOSPITAL_COMMUNITY)
Admission: RE | Admit: 2012-06-07 | Discharge: 2012-06-07 | Disposition: A | Discharge status: Home / Self Care | Payer: Medicaid Other | Source: Ambulatory Visit | Attending: Obstetrics | Admitting: Obstetrics

## 2012-06-07 DIAGNOSIS — O09529 Supervision of elderly multigravida, unspecified trimester: Secondary | ICD-10-CM | POA: Insufficient documentation

## 2012-06-07 DIAGNOSIS — O358XX0 Maternal care for other (suspected) fetal abnormality and damage, not applicable or unspecified: Secondary | ICD-10-CM | POA: Insufficient documentation

## 2012-06-07 DIAGNOSIS — E669 Obesity, unspecified: Secondary | ICD-10-CM | POA: Insufficient documentation

## 2012-06-15 ENCOUNTER — Inpatient Hospital Stay (HOSPITAL_COMMUNITY)
Admission: AD | Admit: 2012-06-15 | Discharge: 2012-06-15 | Disposition: A | Discharge status: Home / Self Care | Payer: Medicaid Other | Source: Ambulatory Visit | Attending: Obstetrics | Admitting: Obstetrics

## 2012-06-15 ENCOUNTER — Encounter (HOSPITAL_COMMUNITY): Payer: Self-pay | Admitting: *Deleted

## 2012-06-15 DIAGNOSIS — N949 Unspecified condition associated with female genital organs and menstrual cycle: Secondary | ICD-10-CM

## 2012-06-15 DIAGNOSIS — R109 Unspecified abdominal pain: Secondary | ICD-10-CM | POA: Insufficient documentation

## 2012-06-15 DIAGNOSIS — M545 Low back pain, unspecified: Secondary | ICD-10-CM | POA: Insufficient documentation

## 2012-06-15 DIAGNOSIS — O99891 Other specified diseases and conditions complicating pregnancy: Secondary | ICD-10-CM | POA: Insufficient documentation

## 2012-06-15 LAB — URINE MICROSCOPIC-ADD ON

## 2012-06-15 LAB — URINALYSIS, ROUTINE W REFLEX MICROSCOPIC
Bilirubin Urine: NEGATIVE
Glucose, UA: NEGATIVE mg/dL
Hgb urine dipstick: NEGATIVE
Specific Gravity, Urine: 1.02 (ref 1.005–1.030)
Urobilinogen, UA: 2 mg/dL — ABNORMAL HIGH (ref 0.0–1.0)
pH: 7 (ref 5.0–8.0)

## 2012-06-15 LAB — WET PREP, GENITAL: Yeast Wet Prep HPF POC: NONE SEEN

## 2012-06-15 NOTE — MAU Note (Signed)
Pt states she started feeling pain yesterday, while sleep pt states pain woke her out of her sleep. Pain is worse when pt is flat on her back.Pt describes pain as constant

## 2012-06-15 NOTE — MAU Note (Signed)
Patient states she started having a right lower abdominal sharp pain on 7-2 that radiates into the right lower back. Pain is constant. Denies any bleeding or leaking, reports good fetal movement. Has a vaginal odor but no dishcarge.

## 2012-06-15 NOTE — MAU Provider Note (Signed)
History     CSN: [id number]  Arrival date and time: 06/15/12 1518   None     Chief Complaint  Patient presents with  . Abdominal Pain   HPI Darlene Black is 36 y.o. G2P1001 110w0d weeks presenting with right sided abdominal pain and right sided lower back pain that began yesterday evening.  Denies vaginal bleeding, nausea vomiting or fever.  Does report frequency of urination without dysuria.   Patient of Dr. Elsie Stain.  Last seen last week.  + fetal movement.      Past Medical History  Diagnosis Date  . Fibroid   . Kidney stone   . Hypertension     Past Surgical History  Procedure Date  . Spine surgery     Family History  Problem Relation Age of Onset  . Anesthesia problems Neg Hx     History  Substance Use Topics  . Smoking status: Former Smoker -- 1.0 packs/day  . Smokeless tobacco: Not on file  . Alcohol Use: No    Allergies: No Known Allergies  Prescriptions prior to admission  Medication Sig Dispense Refill  . acetaminophen (TYLENOL) 500 MG tablet Take 500 mg by mouth every 6 (six) hours as needed. Stomach and back pain      . Prenatal Vit-Fe Fumarate-FA (PRENATAL MULTIVITAMIN) TABS Take 1 tablet by mouth daily.        Review of Systems  Constitutional: Negative for fever.  Gastrointestinal: Positive for abdominal pain (right sided). Negative for nausea and vomiting.  Genitourinary:       Negative for vaginal bleeding or discharge  Musculoskeletal: Positive for back pain (right).   Physical Exam   Blood pressure 105/55, pulse 104, temperature 99.1 F (37.3 C), temperature source Oral, resp. rate 20, height 5' 2.5" (1.588 m), weight 114.306 kg (252 lb), last menstrual period 01/05/2012, SpO2 100.00%.  Physical Exam  Constitutional: She is oriented to person, place, and time. She appears well-developed and well-nourished. No distress.  HENT:  Head: Normocephalic.  Neck: Normal range of motion.  Cardiovascular: Normal rate.   Respiratory: Effort  normal.  GI: Soft. She exhibits no distension and no mass. There is tenderness (right sided mild tenderness on the side ). There is no rebound and no guarding.  Genitourinary:       Cervical exam by Massie Bougie, RN, closed posterior  Musculoskeletal:       Mild tenderness right lower back  Neurological: She is alert and oriented to person, place, and time.  Skin: Skin is warm and dry.  Psychiatric: She has a normal mood and affect. Her behavior is normal. Thought content normal.    Results for orders placed during the hospital encounter of 06/15/12 (from the past 24 hour(s))  URINALYSIS, ROUTINE W REFLEX MICROSCOPIC     Status: Abnormal   Collection Time   06/15/12  3:35 PM      Component Value Range   Color, Urine AMBER (*) YELLOW   APPearance HAZY (*) CLEAR   Specific Gravity, Urine 1.020  1.005 - 1.030   pH 7.0  5.0 - 8.0   Glucose, UA NEGATIVE  NEGATIVE mg/dL   Hgb urine dipstick NEGATIVE  NEGATIVE   Bilirubin Urine NEGATIVE  NEGATIVE   Ketones, ur NEGATIVE  NEGATIVE mg/dL   Protein, ur 30 (*) NEGATIVE mg/dL   Urobilinogen, UA 2.0 (*) 0.0 - 1.0 mg/dL   Nitrite NEGATIVE  NEGATIVE   Leukocytes, UA NEGATIVE  NEGATIVE  URINE MICROSCOPIC-ADD ON     Status:  Abnormal   Collection Time   06/15/12  3:35 PM      Component Value Range   Squamous Epithelial / LPF MANY (*) RARE   WBC, UA 0-2  <3 WBC/hpf   Bacteria, UA FEW (*) RARE   Crystals CA OXALATE CRYSTALS (*) NEGATIVE   Urine-Other MUCOUS PRESENT      Results for orders placed during the hospital encounter of 06/15/12 (from the past 24 hour(s))  URINALYSIS, ROUTINE W REFLEX MICROSCOPIC     Status: Abnormal   Collection Time   06/15/12  3:35 PM      Component Value Range   Color, Urine AMBER (*) YELLOW   APPearance HAZY (*) CLEAR   Specific Gravity, Urine 1.020  1.005 - 1.030   pH 7.0  5.0 - 8.0   Glucose, UA NEGATIVE  NEGATIVE mg/dL   Hgb urine dipstick NEGATIVE  NEGATIVE   Bilirubin Urine NEGATIVE  NEGATIVE   Ketones, ur  NEGATIVE  NEGATIVE mg/dL   Protein, ur 30 (*) NEGATIVE mg/dL   Urobilinogen, UA 2.0 (*) 0.0 - 1.0 mg/dL   Nitrite NEGATIVE  NEGATIVE   Leukocytes, UA NEGATIVE  NEGATIVE  URINE MICROSCOPIC-ADD ON     Status: Abnormal   Collection Time   06/15/12  3:35 PM      Component Value Range   Squamous Epithelial / LPF MANY (*) RARE   WBC, UA 0-2  <3 WBC/hpf   Bacteria, UA FEW (*) RARE   Crystals CA OXALATE CRYSTALS (*) NEGATIVE   Urine-Other MUCOUS PRESENT    WET PREP, GENITAL     Status: Abnormal   Collection Time   06/15/12  5:30 PM      Component Value Range   Yeast Wet Prep HPF POC NONE SEEN  NONE SEEN   Trich, Wet Prep NONE SEEN  NONE SEEN   Clue Cells Wet Prep HPF POC NONE SEEN  NONE SEEN   WBC, Wet Prep HPF POC FEW (*) NONE SEEN   MAU Course  Procedures  MDM No contractions seen on the monitor.  FHR at beginning of the strip 155, 1 > 15x15 accel.  Unable to continuously monitor FHR b/c of gestation and body habitus. 18:00 reported to Dr. Tamela Oddi MSE, physical findings and lab results.  Order given to check her cervix.    Assessment and Plan  A;  Round ligament pain  P:  May take tylenol for discomfort. Keep scheduled appt with Dr. Nolberto Hanlon 06/15/2012, 4:22 PM   AT time of discharge from MAU, patient told Belinda, RN that she had a vaginal odor.  Wet prep was ordered.

## 2012-08-12 ENCOUNTER — Encounter (HOSPITAL_COMMUNITY): Payer: Self-pay | Admitting: Family

## 2012-08-12 ENCOUNTER — Inpatient Hospital Stay (HOSPITAL_COMMUNITY)
Admission: AD | Admit: 2012-08-12 | Discharge: 2012-08-12 | Disposition: A | Discharge status: Home / Self Care | Payer: Medicaid Other | Source: Ambulatory Visit | Attending: Obstetrics | Admitting: Obstetrics

## 2012-08-12 DIAGNOSIS — M549 Dorsalgia, unspecified: Secondary | ICD-10-CM | POA: Insufficient documentation

## 2012-08-12 DIAGNOSIS — O1200 Gestational edema, unspecified trimester: Secondary | ICD-10-CM

## 2012-08-12 DIAGNOSIS — O26899 Other specified pregnancy related conditions, unspecified trimester: Secondary | ICD-10-CM

## 2012-08-12 DIAGNOSIS — IMO0002 Reserved for concepts with insufficient information to code with codable children: Secondary | ICD-10-CM | POA: Insufficient documentation

## 2012-08-12 LAB — COMPREHENSIVE METABOLIC PANEL
Alkaline Phosphatase: 119 U/L — ABNORMAL HIGH (ref 39–117)
BUN: 8 mg/dL (ref 6–23)
CO2: 23 mEq/L (ref 19–32)
Calcium: 8.9 mg/dL (ref 8.4–10.5)
GFR calc Af Amer: >90 mL/min (ref >90)
GFR calc non Af Amer: >90 mL/min (ref >90)
Glucose, Bld: 87 mg/dL (ref 70–99)
Potassium: 3.6 mEq/L (ref 3.5–5.1)
Total Protein: 6.3 g/dL (ref 6.0–8.3)

## 2012-08-12 LAB — CBC
HCT: 27.6 % — ABNORMAL LOW (ref 36.0–46.0)
Hemoglobin: 9.7 g/dL — ABNORMAL LOW (ref 12.0–15.0)
MCH: 29.4 pg (ref 26.0–34.0)
MCHC: 35.1 g/dL (ref 30.0–36.0)
RBC: 3.3 MIL/uL — ABNORMAL LOW (ref 3.87–5.11)

## 2012-08-12 LAB — URINALYSIS, ROUTINE W REFLEX MICROSCOPIC
Bilirubin Urine: NEGATIVE
Glucose, UA: NEGATIVE mg/dL
Hgb urine dipstick: NEGATIVE
Ketones, ur: 15 mg/dL — AB
pH: 7 (ref 5.0–8.0)

## 2012-08-12 MED ORDER — CYCLOBENZAPRINE HCL 10 MG PO TABS: 10.0000 mg | ORAL_TABLET | Freq: Three times a day (TID) | ORAL | Status: AC | PRN

## 2012-08-12 MED ORDER — CYCLOBENZAPRINE HCL 10 MG PO TABS
10.0000 mg | ORAL_TABLET | Freq: Once | ORAL | Status: AC
  Administered 2012-08-12: 10 mg via ORAL
  Filled 2012-08-12: qty 1

## 2012-08-12 NOTE — MAU Provider Note (Signed)
History     CSN: [id number]  Arrival date and time: 08/12/12 1143   First Provider Initiated Contact with Patient 08/12/12 1330      Chief Complaint  Patient presents with  . Leg Swelling   HPI  Patient states she has increasing in the swelling in the feet and lower legs for several days and now her feet are hurting very bad. Has mid back pain in the middle of the night, and hip pain. Patient denies any contractions, bleeding or leaking and reports good fetal movement. No report of headache, visual changes or epigastric pain.     Past Medical History  Diagnosis Date  . Kidney stone   . Hypertension   . Fibroid     Past Surgical History  Procedure Date  . Spine surgery February 2011    C6 metal bracing     Family History  Problem Relation Age of Onset  . Anesthesia problems Neg Hx     History  Substance Use Topics  . Smoking status: Former Smoker -- 1.0 packs/day for 20 years    Types: Cigarettes    Quit date: 01/28/2012  . Smokeless tobacco: Not on file  . Alcohol Use: No    Allergies: No Known Allergies  Prescriptions prior to admission  Medication Sig Dispense Refill  . acetaminophen (TYLENOL) 500 MG tablet Take 500 mg by mouth every 6 (six) hours as needed. Stomach and back pain      . OVER THE COUNTER MEDICATION Take 1 tablet by mouth daily. Patient takes over the counter Iron tablet      . Prenatal Vit-Fe Fumarate-FA (PRENATAL MULTIVITAMIN) TABS Take 1 tablet by mouth daily.        Review of Systems  Eyes: Negative for blurred vision and double vision.  Musculoskeletal: Positive for back pain and joint pain.       Leg swelling  Neurological: Negative for headaches.  All other systems reviewed and are negative.   Physical Exam   Blood pressure 134/87, pulse 100, temperature 98.2 F (36.8 C), temperature source Oral, resp. rate 20, height 5\' 3"  (1.6 m), weight 116.937 kg (257 lb 12.8 oz), last menstrual period 01/05/2012, SpO2 100.00%. Blood  Culture  Physical Exam  Constitutional: She is oriented to person, place, and time. She appears well-developed and well-nourished. No distress.  HENT:  Head: Normocephalic.  Neck: Normal range of motion. Neck supple.  Cardiovascular: Normal rate and regular rhythm.   Respiratory: Effort normal and breath sounds normal.  Genitourinary: No bleeding around the vagina.  Musculoskeletal: She exhibits edema (3+).  Neurological: She is alert and oriented to person, place, and time. She has normal reflexes. She displays normal reflexes.  Skin: Skin is warm and dry.    MAU Course  Procedures  Flexeril 10 mg  Results for orders placed during the hospital encounter of 08/12/12 (from the past 24 hour(s))  URINALYSIS, ROUTINE W REFLEX MICROSCOPIC     Status: Abnormal   Collection Time   08/12/12 12:10 PM      Component Value Range   Color, Urine YELLOW  YELLOW   APPearance CLEAR  CLEAR   Specific Gravity, Urine 1.015  1.005 - 1.030   pH 7.0  5.0 - 8.0   Glucose, UA NEGATIVE  NEGATIVE mg/dL   Hgb urine dipstick NEGATIVE  NEGATIVE   Bilirubin Urine NEGATIVE  NEGATIVE   Ketones, ur 15 (*) NEGATIVE mg/dL   Protein, ur NEGATIVE  NEGATIVE mg/dL   Urobilinogen, UA 0.2  0.0 - 1.0 mg/dL   Nitrite NEGATIVE  NEGATIVE   Leukocytes, UA NEGATIVE  NEGATIVE  CBC     Status: Abnormal   Collection Time   08/12/12  1:51 PM      Component Value Range   WBC 5.9  4.0 - 10.5 K/uL   RBC 3.30 (*) 3.87 - 5.11 MIL/uL   Hemoglobin 9.7 (*) 12.0 - 15.0 g/dL   HCT 16.1 (*) 09.6 - 04.5 %   MCV 83.6  78.0 - 100.0 fL   MCH 29.4  26.0 - 34.0 pg   MCHC 35.1  30.0 - 36.0 g/dL   RDW 40.9  81.1 - 91.4 %   Platelets 214  150 - 400 K/uL  COMPREHENSIVE METABOLIC PANEL     Status: Abnormal   Collection Time   08/12/12  1:51 PM      Component Value Range   Sodium 133 (*) 135 - 145 mEq/L   Potassium 3.6  3.5 - 5.1 mEq/L   Chloride 102  96 - 112 mEq/L   CO2 23  19 - 32 mEq/L   Glucose, Bld 87  70 - 99 mg/dL   BUN 8  6  - 23 mg/dL   Creatinine, Ser 7.82  0.50 - 1.10 mg/dL   Calcium 8.9  8.4 - 95.6 mg/dL   Total Protein 6.3  6.0 - 8.3 g/dL   Albumin 2.3 (*) 3.5 - 5.2 g/dL   AST 23  0 - 37 U/L   ALT 13  0 - 35 U/L   Alkaline Phosphatase 119 (*) 39 - 117 U/L   Total Bilirubin 0.2 (*) 0.3 - 1.2 mg/dL   GFR calc non Af Amer >90  >90 mL/min   GFR calc Af Amer >90  >90 mL/min   FHR 130's, +accels, reactive Toco - none  Assessment and Plan  Back Pain in Pregnancy Physiologic Edema of Pregnancy  Plan: DC to home Encouraged increased fluid intake/cucumbers (natural diuretic) RX Flexeril Keep scheduled appointment  [facility] 08/12/2012, 1:31 PM

## 2012-08-12 NOTE — MAU Note (Signed)
Patient states she has increasing in the swelling in the feet and lower legs for several days and now her feet are hurting very bad. Has mid back pain in the middle of the night, and hip pain. Patient denies any contractions, bleeding or leaking and reports good fetal movement.

## 2012-08-18 ENCOUNTER — Inpatient Hospital Stay (HOSPITAL_COMMUNITY)
Admission: AD | Admit: 2012-08-18 | Discharge: 2012-08-18 | Disposition: A | Discharge status: Hospice | Payer: Medicaid Other | Source: Ambulatory Visit | Attending: Obstetrics | Admitting: Obstetrics

## 2012-08-18 ENCOUNTER — Encounter (HOSPITAL_COMMUNITY): Payer: Self-pay | Admitting: *Deleted

## 2012-08-18 DIAGNOSIS — R040 Epistaxis: Secondary | ICD-10-CM

## 2012-08-18 DIAGNOSIS — R042 Hemoptysis: Secondary | ICD-10-CM | POA: Insufficient documentation

## 2012-08-18 DIAGNOSIS — O99891 Other specified diseases and conditions complicating pregnancy: Secondary | ICD-10-CM | POA: Insufficient documentation

## 2012-08-18 MED ORDER — SODIUM CHLORIDE 0.65 % NA SOLN: 1.0000 | NASAL | Status: DC | PRN

## 2012-08-18 NOTE — MAU Note (Signed)
Pt reports "I have been spitting blood", states no vomiting, "slight cough", feels like she has something in her throat and when she spits it is "only blood".

## 2012-08-18 NOTE — MAU Provider Note (Signed)
  History     CSN: [id number]  Arrival date and time: 08/18/12 3086   First Provider Initiated Contact with Patient 08/18/12 2041      No chief complaint on file.  HPI This is a 36 y.o. female at [redacted]w[redacted]d who presents with c/o  "spitting blood".   States does not vomit it or cough it up. States feels "phlegm" in throat then spits up blood. States is bright red. Denies pain or other symptoms. No cough or sore throat. No abdominal pain.    OB History    Grav Para Term Preterm Abortions TAB SAB Ect Mult Living   2 1 1  0 0 0 0 0 0 1      Past Medical History  Diagnosis Date  . Kidney stone   . Hypertension   . Fibroid     Past Surgical History  Procedure Date  . Spine surgery February 2011    C6 metal bracing     Family History  Problem Relation Age of Onset  . Anesthesia problems Neg Hx     History  Substance Use Topics  . Smoking status: Former Smoker -- 1.0 packs/day for 20 years    Types: Cigarettes    Quit date: 01/28/2012  . Smokeless tobacco: Not on file  . Alcohol Use: No    Allergies: No Known Allergies  Prescriptions prior to admission  Medication Sig Dispense Refill  . acetaminophen (TYLENOL) 500 MG tablet Take 500 mg by mouth every 6 (six) hours as needed. Stomach and back pain      . cyclobenzaprine (FLEXERIL) 10 MG tablet Take 1 tablet (10 mg total) by mouth 3 (three) times daily as needed for muscle spasms.  30 tablet  0  . ferrous sulfate 325 (65 FE) MG tablet Take 325 mg by mouth daily with breakfast.      . Prenatal Vit-Fe Fumarate-FA (PRENATAL MULTIVITAMIN) TABS Take 1 tablet by mouth daily.        ROS  As in HPI  Physical Exam   Blood pressure 142/77, pulse 120, temperature 98.1 F (36.7 C), resp. rate 18, height 5\' 2"  (1.575 m), weight 256 lb (116.121 kg), last menstrual period 01/05/2012, SpO2 99.00%.  Physical Exam  Constitutional: She is oriented to person, place, and time. She appears well-developed and well-nourished. No distress.    HENT:  Head: Normocephalic and atraumatic.  Nose: Nose normal.  Mouth/Throat: Oropharynx is clear and moist. No oropharyngeal exudate.  Neck: Normal range of motion. Neck supple.  Cardiovascular: Normal rate and normal heart sounds.   Respiratory: Effort normal and breath sounds normal. No respiratory distress. She has no wheezes. She has no rales.  GI: Soft.  Musculoskeletal: Normal range of motion.  Neurological: She is alert and oriented to person, place, and time.  Skin: Skin is warm and dry.  Psychiatric: She has a normal mood and affect.    MAU Course  Procedures  Assessment and Plan  A:  SIUP at [redacted]w[redacted]d       Suspect the blood is a result of a posterior Nosebleed.  No other source of blood identified.  No blood on exam.  P:  Supportive care       Humidifier       Saline nasal spray       Follow up in office  [facility] 08/18/2012, 9:14 PM

## 2012-09-07 ENCOUNTER — Inpatient Hospital Stay (HOSPITAL_COMMUNITY)
Admission: AD | Admit: 2012-09-07 | Discharge: 2012-09-07 | Disposition: A | Discharge status: Home / Self Care | Payer: Medicaid Other | Source: Ambulatory Visit | Attending: Obstetrics | Admitting: Obstetrics

## 2012-09-07 ENCOUNTER — Encounter (HOSPITAL_COMMUNITY): Payer: Self-pay | Admitting: *Deleted

## 2012-09-07 DIAGNOSIS — O169 Unspecified maternal hypertension, unspecified trimester: Secondary | ICD-10-CM

## 2012-09-07 DIAGNOSIS — O139 Gestational [pregnancy-induced] hypertension without significant proteinuria, unspecified trimester: Secondary | ICD-10-CM

## 2012-09-07 LAB — URIC ACID: Uric Acid, Serum: 3.9 mg/dL (ref 2.4–7.0)

## 2012-09-07 LAB — COMPREHENSIVE METABOLIC PANEL
Albumin: 2.5 g/dL — ABNORMAL LOW (ref 3.5–5.2)
Alkaline Phosphatase: 172 U/L — ABNORMAL HIGH (ref 39–117)
BUN: 9 mg/dL (ref 6–23)
Potassium: 3.2 mEq/L — ABNORMAL LOW (ref 3.5–5.1)
Total Protein: 6.8 g/dL (ref 6.0–8.3)

## 2012-09-07 LAB — LACTATE DEHYDROGENASE: LDH: 169 U/L (ref 94–250)

## 2012-09-07 LAB — CBC
HCT: 29.6 % — ABNORMAL LOW (ref 36.0–46.0)
MCHC: 34.5 g/dL (ref 30.0–36.0)
RDW: 13.2 % (ref 11.5–15.5)

## 2012-09-07 NOTE — MAU Provider Note (Signed)
History     CSN: [id number]  Arrival date and time: 09/07/12 1805   First Provider Initiated Contact with Patient 09/07/12 1840      Chief Complaint  Patient presents with  . Hypertension   HPI Darlene Black 36 y.o. [redacted]w[redacted]d  Was seen in the office today and her blood pressure was 150/100 in the office.  No protein in urine in the office today.  Needs labs done and monitoring of BP and FHT.  Client is in no distress at this time.  OB History    Grav Para Term Preterm Abortions TAB SAB Ect Mult Living   2 1 1  0 0 0 0 0 0 1      Past Medical History  Diagnosis Date  . Kidney stone   . Hypertension   . Fibroid     Past Surgical History  Procedure Date  . Spine surgery February 2011    C6 metal bracing     Family History  Problem Relation Age of Onset  . Anesthesia problems Neg Hx   . Other Neg Hx     History  Substance Use Topics  . Smoking status: Former Smoker -- 1.0 packs/day for 20 years    Types: Cigarettes    Quit date: 01/28/2012  . Smokeless tobacco: Not on file  . Alcohol Use: No    Allergies: No Known Allergies  Prescriptions prior to admission  Medication Sig Dispense Refill  . acetaminophen (TYLENOL) 500 MG tablet Take 500 mg by mouth every 6 (six) hours as needed. Stomach and back pain      . ferrous sulfate 325 (65 FE) MG tablet Take 325 mg by mouth daily with breakfast.      . Prenatal Vit-Fe Fumarate-FA (PRENATAL MULTIVITAMIN) TABS Take 1 tablet by mouth daily.      . sodium chloride (OCEAN) 0.65 % nasal spray Place 1 spray into the nose as needed for congestion.  15 mL  12    ROS Physical Exam   Blood pressure 154/88, pulse 107, temperature 98.9 F (37.2 C), temperature source Oral, resp. rate 20, height 5\' 2"  (1.575 m), weight 116.574 kg (257 lb), last menstrual period 01/05/2012.  Physical Exam  Nursing note and vitals reviewed. Constitutional: She is oriented to person, place, and time. She appears well-developed and well-nourished.        obese  HENT:  Head: Normocephalic.  Eyes: EOM are normal.  Neck: Neck supple.  Respiratory:       Has constant pain behind ribs under her right arm.  No increased tenderness with palpation.  Unable to lie on right side due to the pain.  GI: Soft. There is no tenderness.       No epigastric tenderness or RUQ tenderness  FHT baseline 145.Marland [id number] accels noted.  Reactive strip  Musculoskeletal: Normal range of motion.       Ankle edema 1+ with R>L  Neurological: She is alert and oriented to person, place, and time.  Skin: Skin is warm and dry.  Psychiatric: She has a normal mood and affect.  Blood pressures 148/86 to 155/88  MAU Course  Procedures Results for orders placed during the hospital encounter of 09/07/12 (from the past 24 hour(s))  CBC     Status: Abnormal   Collection Time   09/07/12  6:35 PM      Component Value Range   WBC 4.4  4.0 - 10.5 K/uL   RBC 3.56 (*) 3.87 - 5.11 MIL/uL   Hemoglobin  10.2 (*) 12.0 - 15.0 g/dL   HCT 16.1 (*) 09.6 - 04.5 %   MCV 83.1  78.0 - 100.0 fL   MCH 28.7  26.0 - 34.0 pg   MCHC 34.5  30.0 - 36.0 g/dL   RDW 40.9  81.1 - 91.4 %   Platelets 184  150 - 400 K/uL  COMPREHENSIVE METABOLIC PANEL     Status: Abnormal   Collection Time   09/07/12  6:35 PM      Component Value Range   Sodium 134 (*) 135 - 145 mEq/L   Potassium 3.2 (*) 3.5 - 5.1 mEq/L   Chloride 102  96 - 112 mEq/L   CO2 21  19 - 32 mEq/L   Glucose, Bld 99  70 - 99 mg/dL   BUN 9  6 - 23 mg/dL   Creatinine, Ser 7.82  0.50 - 1.10 mg/dL   Calcium 9.1  8.4 - 95.6 mg/dL   Total Protein 6.8  6.0 - 8.3 g/dL   Albumin 2.5 (*) 3.5 - 5.2 g/dL   AST 17  0 - 37 U/L   ALT 10  0 - 35 U/L   Alkaline Phosphatase 172 (*) 39 - 117 U/L   Total Bilirubin 0.2 (*) 0.3 - 1.2 mg/dL   GFR calc non Af Amer >90  >90 mL/min   GFR calc Af Amer >90  >90 mL/min  LACTATE DEHYDROGENASE     Status: Normal   Collection Time   09/07/12  6:35 PM      Component Value Range   LDH 169  94 - 250 U/L  URIC  ACID     Status: Normal   Collection Time   09/07/12  6:35 PM      Component Value Range   Uric Acid, Serum 3.9  2.4 - 7.0 mg/dL    MDM Labs drawn. 2034  Reviewed labs and plan of care with Dr. Gaynell Face  Assessment and Plan  Hypertension in pregnancy Stable  Plan Keep your appointment next week in the office. Call your doctor if you develop worsening symptoms.  BURLESON,TERRI 09/07/2012, 7:45 PM

## 2012-09-07 NOTE — MAU Note (Signed)
BP elevated,  Sent from office for further eval,  State has been steadily rising the past couple wks.  Denies HA, visual changes, increase in swelling. No epigastric pain, but has had constant pain in right upper side, near lower rib.

## 2012-10-04 ENCOUNTER — Telehealth (HOSPITAL_COMMUNITY): Payer: Self-pay | Admitting: *Deleted

## 2012-10-04 ENCOUNTER — Encounter (HOSPITAL_COMMUNITY): Payer: Self-pay | Admitting: *Deleted

## 2012-10-04 NOTE — Telephone Encounter (Signed)
Preadmission screen  

## 2012-10-05 ENCOUNTER — Inpatient Hospital Stay (HOSPITAL_COMMUNITY)
Admission: AD | Admit: 2012-10-05 | Discharge: 2012-10-05 | Disposition: A | Discharge status: Home / Self Care | Payer: Medicaid Other | Source: Ambulatory Visit | Attending: Obstetrics | Admitting: Obstetrics

## 2012-10-05 DIAGNOSIS — O139 Gestational [pregnancy-induced] hypertension without significant proteinuria, unspecified trimester: Secondary | ICD-10-CM

## 2012-10-05 DIAGNOSIS — O10019 Pre-existing essential hypertension complicating pregnancy, unspecified trimester: Secondary | ICD-10-CM | POA: Insufficient documentation

## 2012-10-05 LAB — COMPREHENSIVE METABOLIC PANEL
Alkaline Phosphatase: 218 U/L — ABNORMAL HIGH (ref 39–117)
BUN: 10 mg/dL (ref 6–23)
CO2: 22 mEq/L (ref 19–32)
Chloride: 100 mEq/L (ref 96–112)
GFR calc Af Amer: >90 mL/min (ref >90)
GFR calc non Af Amer: >90 mL/min (ref >90)
Glucose, Bld: 102 mg/dL — ABNORMAL HIGH (ref 70–99)
Potassium: 3.4 mEq/L — ABNORMAL LOW (ref 3.5–5.1)
Total Bilirubin: 0.2 mg/dL — ABNORMAL LOW (ref 0.3–1.2)

## 2012-10-05 LAB — URIC ACID: Uric Acid, Serum: 4.9 mg/dL (ref 2.4–7.0)

## 2012-10-05 LAB — CBC
MCH: 29.4 pg (ref 26.0–34.0)
MCHC: 35.2 g/dL (ref 30.0–36.0)
MCV: 83.7 fL (ref 78.0–100.0)
Platelets: 192 10*3/uL (ref 150–400)

## 2012-10-05 LAB — URINALYSIS, ROUTINE W REFLEX MICROSCOPIC
Bilirubin Urine: NEGATIVE
Hgb urine dipstick: NEGATIVE
Ketones, ur: 15 mg/dL — AB
Nitrite: NEGATIVE
Urobilinogen, UA: 2 mg/dL — ABNORMAL HIGH (ref 0.0–1.0)

## 2012-10-05 MED ORDER — ACETAMINOPHEN 325 MG PO TABS
650.0000 mg | ORAL_TABLET | Freq: Once | ORAL | Status: AC
  Administered 2012-10-05: 650 mg via ORAL
  Filled 2012-10-05: qty 2

## 2012-10-05 NOTE — MAU Note (Signed)
Pt sent over from Dr. Elsie Stain office for [facility] w/u- bp 140/90 in office; possible IOL.

## 2012-10-05 NOTE — MAU Note (Signed)
Pt states sent from MD office for PIH eval, denies h/a, dizziness, or blurred vision. Has been unusually tired per pt. Cervix 1 cm in office. Denies contractions however notes low back and pelvic pain. Took tylenol #3 (2) last pm and felt relief.

## 2012-10-05 NOTE — MAU Provider Note (Signed)
History     CSN: [id number]  Arrival date and time: 10/05/12 1715   First Provider Initiated Contact with Patient 10/05/12 1752      Chief Complaint  Patient presents with  . Hypertension   HPI Darlene Black 36 y.o. [redacted]w[redacted]d   Seen in the office today and BP was 150/90.  Sent from the office for further evaluation.  OB History    Grav Para Term Preterm Abortions TAB SAB Ect Mult Living   2 1 1  0 0 0 0 0 0 1      Past Medical History  Diagnosis Date  . Kidney stone   . Hypertension   . Fibroid     Past Surgical History  Procedure Date  . Spine surgery February 2011    C6 metal bracing     Family History  Problem Relation Age of Onset  . Anesthesia problems Neg Hx   . Other Neg Hx   . Hypertension Mother   . Heart disease Mother   . Arthritis Mother   . Hyperlipidemia Mother   . Hypertension Maternal Grandmother   . Heart disease Maternal Grandmother   . Arthritis Maternal Grandmother   . Hypertension Maternal Grandfather   . Heart disease Maternal Grandfather     History  Substance Use Topics  . Smoking status: Former Smoker -- 1.0 packs/day for 20 years    Types: Cigarettes    Quit date: 01/28/2012  . Smokeless tobacco: Not on file  . Alcohol Use: No    Allergies: No Known Allergies  Prescriptions prior to admission  Medication Sig Dispense Refill  . acetaminophen (TYLENOL) 500 MG tablet Take 500 mg by mouth every 6 (six) hours as needed. Stomach and back pain      . cyclobenzaprine (FLEXERIL) 5 MG tablet Take 5 mg by mouth 3 (three) times daily as needed.      . ferrous sulfate 325 (65 FE) MG tablet Take 325 mg by mouth daily with breakfast.      . Prenatal Vit-Fe Fumarate-FA (PRENATAL MULTIVITAMIN) TABS Take 1 tablet by mouth daily.      . sodium chloride (OCEAN) 0.65 % nasal spray Place 1 spray into the nose as needed for congestion.  15 mL  12    Review of Systems  Constitutional: Negative for fever.  Gastrointestinal: Negative for nausea,  vomiting and abdominal pain.  Genitourinary:       No vaginal discharge. No vaginal bleeding. No dysuria.   Physical Exam   Blood pressure 148/92, pulse 108, temperature 98.4 F (36.9 C), temperature source Oral, resp. rate 16, height 5\' 2"  (1.575 m), weight 116.348 kg (256 lb 8 oz), last menstrual period 01/05/2012, SpO2 99.00%.  Physical Exam  Nursing note and vitals reviewed. Constitutional: She is oriented to person, place, and time. She appears well-developed and well-nourished.  HENT:  Head: Normocephalic.  Eyes: EOM are normal.  Neck: Neck supple.  GI: Soft. There is no tenderness.       FHT baseline 150.  Reactive strip.  Irregular contractions.  Musculoskeletal: Normal range of motion.  Neurological: She is alert and oriented to person, place, and time.  Skin: Skin is warm and dry.  Psychiatric: She has a normal mood and affect.    MAU Course  Procedures  MDM Results for orders placed during the hospital encounter of 10/05/12 (from the past 24 hour(s))  URINALYSIS, ROUTINE W REFLEX MICROSCOPIC     Status: Abnormal   Collection Time   10/05/12  5:34 PM      Component Value Range   Color, Urine YELLOW  YELLOW   APPearance CLEAR  CLEAR   Specific Gravity, Urine 1.025  1.005 - 1.030   pH 6.0  5.0 - 8.0   Glucose, UA NEGATIVE  NEGATIVE mg/dL   Hgb urine dipstick NEGATIVE  NEGATIVE   Bilirubin Urine NEGATIVE  NEGATIVE   Ketones, ur 15 (*) NEGATIVE mg/dL   Protein, ur NEGATIVE  NEGATIVE mg/dL   Urobilinogen, UA 2.0 (*) 0.0 - 1.0 mg/dL   Nitrite NEGATIVE  NEGATIVE   Leukocytes, UA NEGATIVE  NEGATIVE  COMPREHENSIVE METABOLIC PANEL     Status: Abnormal   Collection Time   10/05/12  6:01 PM      Component Value Range   Sodium 134 (*) 135 - 145 mEq/L   Potassium 3.4 (*) 3.5 - 5.1 mEq/L   Chloride 100  96 - 112 mEq/L   CO2 22  19 - 32 mEq/L   Glucose, Bld 102 (*) 70 - 99 mg/dL   BUN 10  6 - 23 mg/dL   Creatinine, Ser 1.61  0.50 - 1.10 mg/dL   Calcium 8.9  8.4 -  09.6 mg/dL   Total Protein 6.5  6.0 - 8.3 g/dL   Albumin 2.5 (*) 3.5 - 5.2 g/dL   AST 23  0 - 37 U/L   ALT 15  0 - 35 U/L   Alkaline Phosphatase 218 (*) 39 - 117 U/L   Total Bilirubin 0.2 (*) 0.3 - 1.2 mg/dL   GFR calc non Af Amer >90  >90 mL/min   GFR calc Af Amer >90  >90 mL/min  URIC ACID     Status: Normal   Collection Time   10/05/12  6:01 PM      Component Value Range   Uric Acid, Serum 4.9  2.4 - 7.0 mg/dL  CBC     Status: Abnormal   Collection Time   10/05/12  7:18 PM      Component Value Range   WBC 4.7  4.0 - 10.5 K/uL   RBC 3.50 (*) 3.87 - 5.11 MIL/uL   Hemoglobin 10.3 (*) 12.0 - 15.0 g/dL   HCT 04.5 (*) 40.9 - 81.1 %   MCV 83.7  78.0 - 100.0 fL   MCH 29.4  26.0 - 34.0 pg   MCHC 35.2  30.0 - 36.0 g/dL   RDW 91.4  78.2 - 95.6 %   Platelets 192  150 - 400 K/uL   1955  Consult with Dr. Gaynell Face.  BP 144/97 to 146/81.  Will discharge today and has induction scheduled for Monday.  Assessment and Plan  Gestational hypertension  Plan Will discharge today and will keep appointment for induction on Monday. Tylenol given for headache which developed while in MAU. Call your doctor if your headache worsens.  [name] 10/05/2012, 5:52 PM

## 2012-10-06 ENCOUNTER — Telehealth (HOSPITAL_COMMUNITY): Payer: Self-pay | Admitting: *Deleted

## 2012-10-06 NOTE — Telephone Encounter (Signed)
Preadmission screen  

## 2012-10-10 ENCOUNTER — Inpatient Hospital Stay (HOSPITAL_COMMUNITY): Payer: Medicaid Other | Admitting: Anesthesiology

## 2012-10-10 ENCOUNTER — Encounter (HOSPITAL_COMMUNITY): Payer: Self-pay | Admitting: Anesthesiology

## 2012-10-10 ENCOUNTER — Encounter (HOSPITAL_COMMUNITY): Payer: Self-pay | Admitting: *Deleted

## 2012-10-10 ENCOUNTER — Encounter (HOSPITAL_COMMUNITY)
Admission: AD | Disposition: A | Discharge status: Home / Self Care | Payer: Self-pay | Source: Ambulatory Visit | Attending: Obstetrics

## 2012-10-10 ENCOUNTER — Inpatient Hospital Stay (HOSPITAL_COMMUNITY): Admission: RE | Admit: 2012-10-10 | Payer: Medicaid Other | Source: Ambulatory Visit | Admitting: Obstetrics

## 2012-10-10 ENCOUNTER — Inpatient Hospital Stay (HOSPITAL_COMMUNITY)
Admission: AD | Admit: 2012-10-10 | Discharge: 2012-10-13 | DRG: 765 | Disposition: A | Discharge status: Home / Self Care | Payer: Medicaid Other | Source: Ambulatory Visit | Attending: Obstetrics | Admitting: Obstetrics

## 2012-10-10 DIAGNOSIS — O139 Gestational [pregnancy-induced] hypertension without significant proteinuria, unspecified trimester: Secondary | ICD-10-CM | POA: Diagnosis present

## 2012-10-10 DIAGNOSIS — Z98891 History of uterine scar from previous surgery: Secondary | ICD-10-CM

## 2012-10-10 DIAGNOSIS — O09529 Supervision of elderly multigravida, unspecified trimester: Secondary | ICD-10-CM | POA: Diagnosis present

## 2012-10-10 DIAGNOSIS — O411090 Infection of amniotic sac and membranes, unspecified, unspecified trimester, not applicable or unspecified: Secondary | ICD-10-CM | POA: Diagnosis present

## 2012-10-10 LAB — PREPARE RBC (CROSSMATCH)

## 2012-10-10 LAB — CBC
Hemoglobin: 11.7 g/dL — ABNORMAL LOW (ref 12.0–15.0)
MCH: 29.4 pg (ref 26.0–34.0)
MCHC: 35.1 g/dL (ref 30.0–36.0)
MCV: 83.7 fL (ref 78.0–100.0)
RBC: 3.98 MIL/uL (ref 3.87–5.11)

## 2012-10-10 LAB — ABO/RH: ABO/RH(D): B POS

## 2012-10-10 SURGERY — Surgical Case
Anesthesia: Epidural | Site: Abdomen | Wound class: Clean Contaminated

## 2012-10-10 MED ORDER — SIMETHICONE 80 MG PO CHEW: 80.0000 mg | CHEWABLE_TABLET | ORAL | Status: DC | PRN

## 2012-10-10 MED ORDER — WITCH HAZEL-GLYCERIN EX PADS: 1.0000 "application " | MEDICATED_PAD | CUTANEOUS | Status: DC | PRN

## 2012-10-10 MED ORDER — BUTORPHANOL TARTRATE 1 MG/ML IJ SOLN
1.0000 mg | INTRAMUSCULAR | Status: DC
  Administered 2012-10-10: 1 mg via INTRAVENOUS
  Filled 2012-10-10: qty 1

## 2012-10-10 MED ORDER — MAGNESIUM SULFATE 40 G IN LACTATED RINGERS - SIMPLE
2.0000 g/h | INTRAVENOUS | Status: DC
  Administered 2012-10-11: 2 g/h via INTRAVENOUS
  Filled 2012-10-10 (×2): qty 500

## 2012-10-10 MED ORDER — OXYTOCIN 40 UNITS IN LACTATED RINGERS INFUSION - SIMPLE MED: 62.5000 mL/h | INTRAVENOUS | Status: DC

## 2012-10-10 MED ORDER — LIDOCAINE HCL (PF) 1 % IJ SOLN
INTRAMUSCULAR | Status: DC | PRN
  Administered 2012-10-10 (×2): 4 mL

## 2012-10-10 MED ORDER — OXYTOCIN 10 UNIT/ML IJ SOLN
INTRAMUSCULAR | Status: AC
  Filled 2012-10-10: qty 1

## 2012-10-10 MED ORDER — SCOPOLAMINE 1 MG/3DAYS TD PT72
MEDICATED_PATCH | TRANSDERMAL | Status: AC
  Administered 2012-10-10: 1.5 mg via TRANSDERMAL
  Filled 2012-10-10: qty 1

## 2012-10-10 MED ORDER — SODIUM CHLORIDE 0.9 % IJ SOLN: 3.0000 mL | INTRAMUSCULAR | Status: DC | PRN

## 2012-10-10 MED ORDER — PHENYLEPHRINE 40 MCG/ML (10ML) SYRINGE FOR IV PUSH (FOR BLOOD PRESSURE SUPPORT): 80.0000 ug | PREFILLED_SYRINGE | INTRAVENOUS | Status: DC | PRN

## 2012-10-10 MED ORDER — PHENYLEPHRINE HCL 10 MG/ML IJ SOLN
INTRAMUSCULAR | Status: DC | PRN
  Administered 2012-10-10 (×5): 40 ug via INTRAVENOUS

## 2012-10-10 MED ORDER — PHENYLEPHRINE 40 MCG/ML (10ML) SYRINGE FOR IV PUSH (FOR BLOOD PRESSURE SUPPORT)
PREFILLED_SYRINGE | INTRAVENOUS | Status: AC
  Filled 2012-10-10: qty 5

## 2012-10-10 MED ORDER — SCOPOLAMINE 1 MG/3DAYS TD PT72
1.0000 | MEDICATED_PATCH | Freq: Once | TRANSDERMAL | Status: AC
  Administered 2012-10-10: 1.5 mg via TRANSDERMAL

## 2012-10-10 MED ORDER — KETOROLAC TROMETHAMINE 30 MG/ML IJ SOLN: 30.0000 mg | Freq: Four times a day (QID) | INTRAMUSCULAR | Status: AC | PRN

## 2012-10-10 MED ORDER — CEFAZOLIN SODIUM-DEXTROSE 2-3 GM-% IV SOLR
INTRAVENOUS | Status: DC | PRN
  Administered 2012-10-10: 2 g via INTRAVENOUS

## 2012-10-10 MED ORDER — ACETAMINOPHEN 325 MG PO TABS
650.0000 mg | ORAL_TABLET | ORAL | Status: DC | PRN
  Administered 2012-10-10 (×2): 650 mg via ORAL
  Filled 2012-10-10 (×2): qty 2

## 2012-10-10 MED ORDER — LABETALOL HCL 5 MG/ML IV SOLN
40.0000 mg | Freq: Once | INTRAVENOUS | Status: DC | PRN
  Filled 2012-10-10: qty 4

## 2012-10-10 MED ORDER — OXYCODONE-ACETAMINOPHEN 5-325 MG PO TABS
1.0000 | ORAL_TABLET | ORAL | Status: DC | PRN
  Administered 2012-10-11: 2 via ORAL
  Administered 2012-10-11 (×2): 1 via ORAL
  Administered 2012-10-11: 2 via ORAL
  Administered 2012-10-11: 1 via ORAL
  Administered 2012-10-12: 2 via ORAL
  Administered 2012-10-12 (×2): 1 via ORAL
  Filled 2012-10-10 (×2): qty 2
  Filled 2012-10-10 (×4): qty 1

## 2012-10-10 MED ORDER — MORPHINE SULFATE (PF) 0.5 MG/ML IJ SOLN
INTRAMUSCULAR | Status: DC | PRN
  Administered 2012-10-10: 4 mg via EPIDURAL

## 2012-10-10 MED ORDER — DIPHENHYDRAMINE HCL 50 MG/ML IJ SOLN: 12.5000 mg | INTRAMUSCULAR | Status: DC | PRN

## 2012-10-10 MED ORDER — LACTATED RINGERS IV SOLN
INTRAVENOUS | Status: DC | PRN
  Administered 2012-10-10 (×3): via INTRAVENOUS

## 2012-10-10 MED ORDER — EPHEDRINE 5 MG/ML INJ: 10.0000 mg | INTRAVENOUS | Status: DC | PRN

## 2012-10-10 MED ORDER — SODIUM BICARBONATE 8.4 % IV SOLN
INTRAVENOUS | Status: DC | PRN
  Administered 2012-10-10: 10 mL via EPIDURAL

## 2012-10-10 MED ORDER — MORPHINE SULFATE 0.5 MG/ML IJ SOLN
INTRAMUSCULAR | Status: AC
  Filled 2012-10-10: qty 8

## 2012-10-10 MED ORDER — SIMETHICONE 80 MG PO CHEW
80.0000 mg | CHEWABLE_TABLET | Freq: Three times a day (TID) | ORAL | Status: DC
  Administered 2012-10-10 – 2012-10-13 (×10): 80 mg via ORAL

## 2012-10-10 MED ORDER — LACTATED RINGERS IV SOLN: 500.0000 mL | Freq: Once | INTRAVENOUS | Status: DC

## 2012-10-10 MED ORDER — MENTHOL 3 MG MT LOZG: 1.0000 | LOZENGE | OROMUCOSAL | Status: DC | PRN

## 2012-10-10 MED ORDER — SODIUM CHLORIDE 0.9 % IV SOLN
3.0000 g | Freq: Four times a day (QID) | INTRAVENOUS | Status: DC
  Administered 2012-10-10 (×2): 3 g via INTRAVENOUS
  Filled 2012-10-10 (×3): qty 3

## 2012-10-10 MED ORDER — IBUPROFEN 600 MG PO TABS
600.0000 mg | ORAL_TABLET | Freq: Four times a day (QID) | ORAL | Status: DC
  Administered 2012-10-10 – 2012-10-13 (×12): 600 mg via ORAL
  Filled 2012-10-10 (×12): qty 1

## 2012-10-10 MED ORDER — LIDOCAINE HCL (PF) 1 % IJ SOLN
30.0000 mL | INTRAMUSCULAR | Status: DC | PRN
  Filled 2012-10-10: qty 30

## 2012-10-10 MED ORDER — NALBUPHINE HCL 10 MG/ML IJ SOLN
5.0000 mg | INTRAMUSCULAR | Status: DC | PRN
  Filled 2012-10-10: qty 1

## 2012-10-10 MED ORDER — KETOROLAC TROMETHAMINE 60 MG/2ML IM SOLN
INTRAMUSCULAR | Status: AC
  Administered 2012-10-10: 60 mg via INTRAMUSCULAR
  Filled 2012-10-10: qty 2

## 2012-10-10 MED ORDER — MAGNESIUM SULFATE BOLUS VIA INFUSION
4.0000 g | Freq: Once | INTRAVENOUS | Status: AC
  Administered 2012-10-10: 4 g via INTRAVENOUS
  Filled 2012-10-10: qty 500

## 2012-10-10 MED ORDER — INFLUENZA VIRUS VACC SPLIT PF IM SUSP
0.5000 mL | INTRAMUSCULAR | Status: AC
  Administered 2012-10-11: 0.5 mL via INTRAMUSCULAR
  Filled 2012-10-10: qty 0.5

## 2012-10-10 MED ORDER — SENNOSIDES-DOCUSATE SODIUM 8.6-50 MG PO TABS
2.0000 | ORAL_TABLET | Freq: Every day | ORAL | Status: DC
  Administered 2012-10-10 – 2012-10-12 (×3): 2 via ORAL

## 2012-10-10 MED ORDER — LANOLIN HYDROUS EX OINT: 1.0000 "application " | TOPICAL_OINTMENT | CUTANEOUS | Status: DC | PRN

## 2012-10-10 MED ORDER — SODIUM CHLORIDE 0.9 % IV SOLN
1.0000 ug/kg/h | INTRAVENOUS | Status: DC | PRN
  Filled 2012-10-10: qty 2.5

## 2012-10-10 MED ORDER — ONDANSETRON HCL 4 MG/2ML IJ SOLN: 4.0000 mg | INTRAMUSCULAR | Status: DC | PRN

## 2012-10-10 MED ORDER — DIPHENHYDRAMINE HCL 25 MG PO CAPS: 25.0000 mg | ORAL_CAPSULE | Freq: Four times a day (QID) | ORAL | Status: DC | PRN

## 2012-10-10 MED ORDER — BUTORPHANOL TARTRATE 1 MG/ML IJ SOLN: 1.0000 mg | INTRAMUSCULAR | Status: DC | PRN

## 2012-10-10 MED ORDER — DIBUCAINE 1 % RE OINT: 1.0000 "application " | TOPICAL_OINTMENT | RECTAL | Status: DC | PRN

## 2012-10-10 MED ORDER — ONDANSETRON HCL 4 MG/2ML IJ SOLN
INTRAMUSCULAR | Status: DC | PRN
  Administered 2012-10-10: 4 mg via INTRAVENOUS

## 2012-10-10 MED ORDER — NALOXONE HCL 0.4 MG/ML IJ SOLN: 0.4000 mg | INTRAMUSCULAR | Status: DC | PRN

## 2012-10-10 MED ORDER — FLEET ENEMA 7-19 GM/118ML RE ENEM: 1.0000 | ENEMA | RECTAL | Status: DC | PRN

## 2012-10-10 MED ORDER — LACTATED RINGERS IV SOLN
INTRAVENOUS | Status: DC
  Administered 2012-10-11: 05:00:00 via INTRAVENOUS

## 2012-10-10 MED ORDER — SODIUM BICARBONATE 8.4 % IV SOLN
INTRAVENOUS | Status: AC
  Filled 2012-10-10: qty 50

## 2012-10-10 MED ORDER — HYDROMORPHONE HCL PF 1 MG/ML IJ SOLN
0.2500 mg | INTRAMUSCULAR | Status: DC | PRN
  Administered 2012-10-10 (×3): 0.5 mg via INTRAVENOUS

## 2012-10-10 MED ORDER — ONDANSETRON HCL 4 MG/2ML IJ SOLN: 4.0000 mg | Freq: Three times a day (TID) | INTRAMUSCULAR | Status: DC | PRN

## 2012-10-10 MED ORDER — IBUPROFEN 600 MG PO TABS: 600.0000 mg | ORAL_TABLET | Freq: Four times a day (QID) | ORAL | Status: DC | PRN

## 2012-10-10 MED ORDER — PRENATAL MULTIVITAMIN CH
1.0000 | ORAL_TABLET | Freq: Every day | ORAL | Status: DC
  Administered 2012-10-12 – 2012-10-13 (×2): 1 via ORAL
  Filled 2012-10-10 (×2): qty 1

## 2012-10-10 MED ORDER — METOCLOPRAMIDE HCL 5 MG/ML IJ SOLN: 10.0000 mg | Freq: Three times a day (TID) | INTRAMUSCULAR | Status: DC | PRN

## 2012-10-10 MED ORDER — KETOROLAC TROMETHAMINE 60 MG/2ML IM SOLN
60.0000 mg | Freq: Once | INTRAMUSCULAR | Status: AC | PRN
  Administered 2012-10-10: 60 mg via INTRAMUSCULAR

## 2012-10-10 MED ORDER — HYDROMORPHONE HCL PF 1 MG/ML IJ SOLN
INTRAMUSCULAR | Status: AC
  Administered 2012-10-10: 0.5 mg via INTRAVENOUS
  Filled 2012-10-10: qty 1

## 2012-10-10 MED ORDER — LIDOCAINE-EPINEPHRINE (PF) 2 %-1:200000 IJ SOLN
INTRAMUSCULAR | Status: AC
  Filled 2012-10-10: qty 20

## 2012-10-10 MED ORDER — LACTATED RINGERS IV SOLN
INTRAVENOUS | Status: DC | PRN
  Administered 2012-10-10 (×2): via INTRAVENOUS

## 2012-10-10 MED ORDER — LABETALOL HCL 5 MG/ML IV SOLN
20.0000 mg | Freq: Once | INTRAVENOUS | Status: AC
  Administered 2012-10-10: 20 mg via INTRAVENOUS
  Filled 2012-10-10: qty 4

## 2012-10-10 MED ORDER — CEFAZOLIN SODIUM-DEXTROSE 2-3 GM-% IV SOLR
2.0000 g | Freq: Three times a day (TID) | INTRAVENOUS | Status: DC
  Administered 2012-10-10 – 2012-10-12 (×6): 2 g via INTRAVENOUS
  Filled 2012-10-10 (×8): qty 50

## 2012-10-10 MED ORDER — TETANUS-DIPHTH-ACELL PERTUSSIS 5-2.5-18.5 LF-MCG/0.5 IM SUSP
0.5000 mL | Freq: Once | INTRAMUSCULAR | Status: AC
  Administered 2012-10-11: 0.5 mL via INTRAMUSCULAR
  Filled 2012-10-10: qty 0.5

## 2012-10-10 MED ORDER — ONDANSETRON HCL 4 MG PO TABS
4.0000 mg | ORAL_TABLET | ORAL | Status: DC | PRN
  Administered 2012-10-11: 4 mg via ORAL
  Filled 2012-10-10: qty 1

## 2012-10-10 MED ORDER — ONDANSETRON HCL 4 MG/2ML IJ SOLN: 4.0000 mg | Freq: Four times a day (QID) | INTRAMUSCULAR | Status: DC | PRN

## 2012-10-10 MED ORDER — OXYTOCIN 40 UNITS IN LACTATED RINGERS INFUSION - SIMPLE MED
1.0000 m[IU]/min | INTRAVENOUS | Status: DC
  Administered 2012-10-10: 1 m[IU]/min via INTRAVENOUS
  Filled 2012-10-10: qty 1000

## 2012-10-10 MED ORDER — ONDANSETRON HCL 4 MG/2ML IJ SOLN
INTRAMUSCULAR | Status: AC
  Filled 2012-10-10: qty 2

## 2012-10-10 MED ORDER — CITRIC ACID-SODIUM CITRATE 334-500 MG/5ML PO SOLN
30.0000 mL | ORAL | Status: DC | PRN
  Administered 2012-10-10: 30 mL via ORAL
  Filled 2012-10-10: qty 15

## 2012-10-10 MED ORDER — ZOLPIDEM TARTRATE 5 MG PO TABS: 5.0000 mg | ORAL_TABLET | Freq: Every evening | ORAL | Status: DC | PRN

## 2012-10-10 MED ORDER — LACTATED RINGERS IV SOLN
500.0000 mL | INTRAVENOUS | Status: DC | PRN
  Administered 2012-10-10: 1000 mL via INTRAVENOUS

## 2012-10-10 MED ORDER — LACTATED RINGERS IV SOLN
INTRAVENOUS | Status: DC
  Administered 2012-10-10 (×2): via INTRAVENOUS

## 2012-10-10 MED ORDER — FENTANYL 2.5 MCG/ML BUPIVACAINE 1/10 % EPIDURAL INFUSION (WH - ANES)
14.0000 mL/h | INTRAMUSCULAR | Status: DC
  Filled 2012-10-10: qty 125

## 2012-10-10 MED ORDER — OXYTOCIN 10 UNIT/ML IJ SOLN
40.0000 [IU] | INTRAVENOUS | Status: DC | PRN
  Administered 2012-10-10: 40 [IU] via INTRAVENOUS

## 2012-10-10 MED ORDER — TERBUTALINE SULFATE 1 MG/ML IJ SOLN: 0.2500 mg | Freq: Once | INTRAMUSCULAR | Status: DC | PRN

## 2012-10-10 MED ORDER — OXYTOCIN BOLUS FROM INFUSION
500.0000 mL | INTRAVENOUS | Status: DC
  Filled 2012-10-10 (×74): qty 500

## 2012-10-10 MED ORDER — OXYCODONE-ACETAMINOPHEN 5-325 MG PO TABS
1.0000 | ORAL_TABLET | ORAL | Status: DC | PRN
  Administered 2012-10-12 – 2012-10-13 (×2): 1 via ORAL
  Filled 2012-10-10: qty 1
  Filled 2012-10-10: qty 2
  Filled 2012-10-10 (×2): qty 1

## 2012-10-10 MED ORDER — EPHEDRINE 5 MG/ML INJ
10.0000 mg | INTRAVENOUS | Status: DC | PRN
  Filled 2012-10-10: qty 4

## 2012-10-10 MED ORDER — DIPHENHYDRAMINE HCL 25 MG PO CAPS: 25.0000 mg | ORAL_CAPSULE | ORAL | Status: DC | PRN

## 2012-10-10 MED ORDER — DIPHENHYDRAMINE HCL 50 MG/ML IJ SOLN: 25.0000 mg | INTRAMUSCULAR | Status: DC | PRN

## 2012-10-10 MED ORDER — FENTANYL 2.5 MCG/ML BUPIVACAINE 1/10 % EPIDURAL INFUSION (WH - ANES)
INTRAMUSCULAR | Status: DC | PRN
  Administered 2012-10-10: 14 mL/h via EPIDURAL

## 2012-10-10 MED ORDER — PHENYLEPHRINE 40 MCG/ML (10ML) SYRINGE FOR IV PUSH (FOR BLOOD PRESSURE SUPPORT)
80.0000 ug | PREFILLED_SYRINGE | INTRAVENOUS | Status: DC | PRN
  Filled 2012-10-10: qty 5

## 2012-10-10 MED ORDER — MEPERIDINE HCL 25 MG/ML IJ SOLN: 6.2500 mg | INTRAMUSCULAR | Status: DC | PRN

## 2012-10-10 MED ORDER — OXYTOCIN 40 UNITS IN LACTATED RINGERS INFUSION - SIMPLE MED
62.5000 mL/h | INTRAVENOUS | Status: AC
  Administered 2012-10-10: 62.5 mL/h
  Administered 2012-10-10: 62.5 mL/h via INTRAVENOUS
  Filled 2012-10-10: qty 1000

## 2012-10-10 SURGICAL SUPPLY — 30 items
CLOTH BEACON ORANGE TIMEOUT ST (SAFETY) ×2 IMPLANT
DERMABOND ADVANCED (GAUZE/BANDAGES/DRESSINGS) ×1
DERMABOND ADVANCED .7 DNX12 (GAUZE/BANDAGES/DRESSINGS) ×1 IMPLANT
DRAPE SURG 17X23 STRL (DRAPES) ×2 IMPLANT
DRSG COVADERM 4X10 (GAUZE/BANDAGES/DRESSINGS) ×2 IMPLANT
DURAPREP 26ML APPLICATOR (WOUND CARE) ×2 IMPLANT
ELECT REM PT RETURN 9FT ADLT (ELECTROSURGICAL) ×2
ELECTRODE REM PT RTRN 9FT ADLT (ELECTROSURGICAL) ×1 IMPLANT
EXTRACTOR VACUUM M CUP 4 TUBE (SUCTIONS) IMPLANT
GLOVE BIO SURGEON STRL SZ8.5 (GLOVE) ×4 IMPLANT
GOWN PREVENTION PLUS LG XLONG (DISPOSABLE) ×4 IMPLANT
GOWN PREVENTION PLUS XXLARGE (GOWN DISPOSABLE) ×2 IMPLANT
KIT ABG SYR 3ML LUER SLIP (SYRINGE) IMPLANT
NEEDLE HYPO 25X5/8 SAFETYGLIDE (NEEDLE) ×2 IMPLANT
NS IRRIG 1000ML POUR BTL (IV SOLUTION) ×2 IMPLANT
PACK C SECTION WH (CUSTOM PROCEDURE TRAY) ×2 IMPLANT
PAD OB MATERNITY 4.3X12.25 (PERSONAL CARE ITEMS) IMPLANT
SLEEVE SCD COMPRESS KNEE MED (MISCELLANEOUS) ×2 IMPLANT
SUT CHROMIC 0 CT 802H (SUTURE) ×2 IMPLANT
SUT CHROMIC 1 CTX 36 (SUTURE) ×6 IMPLANT
SUT CHROMIC 2 0 SH (SUTURE) ×2 IMPLANT
SUT GUT PLAIN 0 CT-3 TAN 27 (SUTURE) IMPLANT
SUT MON AB 4-0 PS1 27 (SUTURE) ×2 IMPLANT
SUT VIC AB 0 CT1 18XCR BRD8 (SUTURE) IMPLANT
SUT VIC AB 0 CT1 8-18 (SUTURE)
SUT VIC AB 0 CTX 36 (SUTURE) ×2
SUT VIC AB 0 CTX36XBRD ANBCTRL (SUTURE) ×2 IMPLANT
TOWEL OR 17X24 6PK STRL BLUE (TOWEL DISPOSABLE) ×4 IMPLANT
TRAY FOLEY CATH 14FR (SET/KITS/TRAYS/PACK) ×2 IMPLANT
WATER STERILE IRR 1000ML POUR (IV SOLUTION) IMPLANT

## 2012-10-10 NOTE — Anesthesia Preprocedure Evaluation (Addendum)
Anesthesia Evaluation  Patient identified by MRN, date of birth, ID band Patient awake    Reviewed: Allergy & Precautions, H&P , NPO status , Patient's Chart, lab work & pertinent test results, reviewed documented beta blocker date and time   Airway Mallampati: II TM Distance: >3 FB Neck ROM: full    Dental No notable dental hx.    Pulmonary former smoker,  breath sounds clear to auscultation  Pulmonary exam normal       Cardiovascular hypertension, On Medications Rhythm:regular Rate:Normal     Neuro/Psych negative neurological ROS  negative psych ROS   GI/Hepatic negative GI ROS, Neg liver ROS,   Endo/Other  Morbid obesity  Renal/GU Renal diseasenegative Renal ROS  negative genitourinary   Musculoskeletal   Abdominal Normal abdominal exam  (+)   Peds  Hematology negative hematology ROS (+)   Anesthesia Other Findings   Reproductive/Obstetrics (+) Pregnancy                         Anesthesia Physical Anesthesia Plan  ASA: III  Anesthesia Plan: Epidural   Post-op Pain Management:    Induction:   Airway Management Planned:   Additional Equipment:   Intra-op Plan:   Post-operative Plan:   Informed Consent: I have reviewed the patients History and Physical, chart, labs and discussed the procedure including the risks, benefits and alternatives for the proposed anesthesia with the patient or authorized representative who has indicated his/her understanding and acceptance.     Plan Discussed with:   Anesthesia Plan Comments:        Anesthesia Quick Evaluation

## 2012-10-10 NOTE — Transfer of Care (Signed)
Immediate Anesthesia Transfer of Care Note  Patient: Darlene Black  Procedure(s) Performed: Procedure(s) (LRB) with comments: CESAREAN SECTION (N/A)  Patient Location: PACU  Anesthesia Type:Epidural  Level of Consciousness: awake, alert , oriented and patient cooperative  Airway & Oxygen Therapy: Patient Spontanous Breathing  Post-op Assessment: Report given to PACU RN and Post -op Vital signs reviewed and stable  Post vital signs: Reviewed and stable  Complications: No apparent anesthesia complications

## 2012-10-10 NOTE — Anesthesia Postprocedure Evaluation (Signed)
  Anesthesia Post-op Note  Patient: Darlene Black  Procedure(s) Performed: Procedure(s) (LRB) with comments: CESAREAN SECTION (N/A)   Patient is awake, responsive, moving her legs, and has signs of resolution of her numbness. Pain and nausea are reasonably well controlled. Vital signs are stable and clinically acceptable. Oxygen saturation is clinically acceptable. There are no apparent anesthetic complications at this time. Patient is ready for discharge.

## 2012-10-10 NOTE — Anesthesia Procedure Notes (Signed)
Epidural Patient location during procedure: OB Start time: 10/10/2012 6:00 AM End time: 10/10/2012 6:15 AM  Staffing Anesthesiologist: Lewie Loron R Performed by: anesthesiologist   Preanesthetic Checklist Completed: patient identified, site marked, surgical consent, pre-op evaluation, timeout performed, IV checked, risks and benefits discussed and monitors and equipment checked  Epidural Patient position: sitting Prep: DuraPrep Patient monitoring: heart rate, continuous pulse ox and blood pressure Injection technique: LOR air and LOR saline  Needle:  Needle type: Tuohy  Needle gauge: 17 G Needle length: 9 cm Needle insertion depth: 9 cm Catheter size: 19 Gauge Catheter at skin depth: 15 cm Test dose: negative  Assessment Sensory level: T8  Additional Notes Patient tolerated procedure well. Good analgesic response.

## 2012-10-10 NOTE — Progress Notes (Signed)
Pt breastfeeding-monitors off

## 2012-10-10 NOTE — Op Note (Signed)
And and preop diagnosis maternal fever chorioamnionitis nonreassuring fetal heart rate tracing failure to progress Postop diagnosis is same Surgeon Dr. Francoise Ceo First assistant Dr. Coral Ceo Anesthesia epidural procedure patient placed on the operating table in the supine position abdomen prepped and draped bladder emptied with a Foley catheter a transverse suprapubic incision made carried down to the rectus fascia fascia cleaned and incised the length of the incision recti muscles retracted laterally peritoneum incised longitudinally transverse incision made on the visceroperitoneum above the bladder bladder mobilized inferiorly a transverse low uterine incision made patient delivered of a female from the OP position Apgar 7 and 9 placenta was removed manually and fetal membranes called to aerobically and anaerobically and sent to pathology the uterine cavity also called to uterine cavity clean and dry laps the uterine incision closed in one layer with continuous looped abnormal one chromic hemostasis satisfactory bladder flap reattached to a chromic uterus well contracted abdomen chosen as peritoneum continuous with of 0 chromic fascia continuous with of 0 Dexon skin shows a subcuticular stitch of 4-0 Monocryl blood loss 500 cc patient tolerated procedure well

## 2012-10-10 NOTE — Progress Notes (Signed)
Farha Dadamo is a 36 y.o. G2P1001 at [redacted]w[redacted]d by LMP admitted for UC's  Subjective:   Objective: BP 188/109  Pulse 111  Temp 100.1 F (37.8 C) (Oral)  Resp 20  Ht 5\' 2"  (1.575 m)  Wt 116.121 kg (256 lb)  BMI 46.82 kg/m2  SpO2 98%  LMP 01/05/2012      FHT:  FHR: 150-160 bpm, variability: minimal ,  accelerations:  Present,  decelerations:  Absent UC:   regular, every 3-4 minutes SVE:   Dilation: 3 Effacement (%): 100 Station: -1 Exam by:: C. Blackstock RN    Labs: Lab Results  Component Value Date   WBC 10.3 10/10/2012   HGB 11.7* 10/10/2012   HCT 33.3* 10/10/2012   MCV 83.7 10/10/2012   PLT 183 10/10/2012    Assessment / Plan: Spontaneous labor, progressing normally.  PIH.  Magnesium sulfate started.  Labor: Progressing normally Preeclampsia:  on magnesium sulfate Fetal Wellbeing:  Category I Pain Control:  Stadol I/D:  n/a Anticipated MOD:  NSVD  HARPER,CHARLES A 10/10/2012, 4:10 AM

## 2012-10-10 NOTE — MAU Note (Signed)
Po ice water 

## 2012-10-10 NOTE — Progress Notes (Signed)
Patient ID: Darlene Black, female   DOB: [birth year], 36 y.o.   MRN: [id number] Patient's temp is now on and and 2.5 ultralong 102.20, 100 at 7 AM pulse 150 in she is receiving Unasyn 2 g IV fluid bolus and Tylenol and the temperature keeps increasing fetal heart is normal 190 chest small bearable is minimal variability she is on magnesium sulfate her contractions have been adequate since 7 AM and the cervix is basically unchanged at 3-4 cm vertex at a -1 to 2 station the fluid is a foul-smelling and because of possible chorioamnionitis nonreassuring fetal heart rate tracing failure to progress in labor she'll be delivered by C-section

## 2012-10-10 NOTE — Progress Notes (Signed)
Notified Dr. Clearance Coots with update on pt. Discussed blood pressure, FHR and overall fetal strip, and patient requesting pain medication and vital signs. Orders received for prn pain medication.

## 2012-10-10 NOTE — Progress Notes (Signed)
Magnesium Sulfate 4 gm. Loading dose started

## 2012-10-10 NOTE — Preoperative (Signed)
Beta Blockers   Reason not to administer Beta Blockers:Not Applicable 

## 2012-10-10 NOTE — H&P (Signed)
Darlene Black is a 36 y.o. female presenting for UC's. Maternal Medical History:  Reason for admission: Reason for admission: contractions.  36 yo G2 P1.  EDC 10-05-12.  Contractions: Onset was 3-5 hours ago.   Frequency: regular.   Perceived severity is moderate.    Prenatal complications: no prenatal complications Prenatal Complications - Diabetes: none.    OB History    Grav Para Term Preterm Abortions TAB SAB Ect Mult Living   2 1 1  0 0 0 0 0 0 1     Past Medical History  Diagnosis Date  . Kidney stone   . Hypertension   . Fibroid    Past Surgical History  Procedure Date  . Spine surgery February 2011    C6 metal bracing    Family History: family history includes Arthritis in her maternal grandmother and mother; Heart disease in her maternal grandfather, maternal grandmother, and mother; Hyperlipidemia in her mother; and Hypertension in her maternal grandfather, maternal grandmother, and mother.  There is no history of Anesthesia problems and Other. Social History:  reports that she quit smoking about 8 months ago. Her smoking use included Cigarettes. She has a 20 pack-year smoking history. She does not have any smokeless tobacco history on file. She reports that she does not drink alcohol or use illicit drugs.   Prenatal Transfer Tool  Maternal Diabetes: No Genetic Screening: Normal Maternal Ultrasounds/Referrals: Normal Fetal Ultrasounds or other Referrals:  None Maternal Substance Abuse:  No Significant Maternal Medications:  None Significant Maternal Lab Results:  Lab values include: Group B Strep negative Other Comments:  None  Review of Systems  All other systems reviewed and are negative.    Dilation: 3 Effacement (%): 100 Station: -1 Exam by:: C. Blackstock RN Blood pressure 188/109, pulse 111, temperature 100.1 F (37.8 C), temperature source Oral, resp. rate 20, height 5\' 2"  (1.575 m), weight 116.121 kg (256 lb), last menstrual period 01/05/2012,  SpO2 98.00%. Maternal Exam:  Uterine Assessment: Contraction strength is moderate.  Contraction frequency is regular.   Abdomen: Patient reports no abdominal tenderness. Pelvis: adequate for delivery.   Cervix: Cervix evaluated by digital exam.     Physical Exam  Constitutional: She is oriented to person, place, and time. She appears well-developed and well-nourished.  HENT:  Head: Normocephalic and atraumatic.  Eyes: Conjunctivae normal are normal. Pupils are equal, round, and reactive to light.  Neck: Normal range of motion. Neck supple.  Cardiovascular: Normal rate and regular rhythm.   Respiratory: Effort normal.  GI: Soft.  Musculoskeletal: Normal range of motion.  Neurological: She is alert and oriented to person, place, and time.  Skin: Skin is warm and dry.  Psychiatric: She has a normal mood and affect. Her behavior is normal. Judgment and thought content normal.    Prenatal labs: ABO, Rh: B/Positive/-- (04/15 0000) Antibody: Negative (04/15 0000) Rubella: Immune (04/15 0000) RPR: Nonreactive (04/15 0000)  HBsAg: Negative (04/15 0000)  HIV: Non-reactive (04/15 0000)  GBS: Negative (09/10 0000)   Assessment/Plan: 40.5 weeks.  Early labor.  Admit.   Katriel Cutsforth A 10/10/2012, 4:03 AM

## 2012-10-10 NOTE — Progress Notes (Signed)
Patient ID: Darlene Black, female   DOB: [birth year], 36 y.o.   MRN: [id number] Patient is now 3 cm dilated 90%  And forming an  anterior lip she is -1 station she's on her magnesium sulfate contractions and decreased IUPC was inserted and she was started on Pitocin 1 and 1 Foley catheter was also inserted

## 2012-10-10 NOTE — MAU Note (Signed)
PT BROUGHT FROM LOBBY TO RM 1.  IS SCH FOR AN INDUCTION AT 0700- CONCERNED   ABOUT VAG BLEEDING WHEN WIPING.  NONE ON PERINEUM NOW.    DENIES HSV AND MRSA.   VE IN OFFICE ON WED  1 CM- BY DR MARSHALL.

## 2012-10-11 ENCOUNTER — Encounter (HOSPITAL_COMMUNITY): Payer: Self-pay | Admitting: Obstetrics

## 2012-10-11 LAB — CBC
MCH: 29.8 pg (ref 26.0–34.0)
Platelets: 163 10*3/uL (ref 150–400)
RBC: 3.49 MIL/uL — ABNORMAL LOW (ref 3.87–5.11)
WBC: 20.5 10*3/uL — ABNORMAL HIGH (ref 4.0–10.5)

## 2012-10-11 NOTE — Progress Notes (Signed)
Patient ID: Darlene Black, female   DOB: July 21, 1976, 36 y.o.   MRN: [id number] Postop day 1 Vital signs normal Output good 2 L [name] will continue her magnesium sulfate until tomorrow morning

## 2012-10-11 NOTE — Progress Notes (Signed)
UR chart review completed.  

## 2012-10-11 NOTE — Addendum Note (Signed)
Addendum  created 10/11/12 1623 by Algis Greenhouse, CRNA   Modules edited:Charges VN, Notes Section

## 2012-10-11 NOTE — Anesthesia Postprocedure Evaluation (Signed)
Anesthesia Post Note  Patient: Darlene Black  Procedure(s) Performed: Procedure(s) (LRB): CESAREAN SECTION (N/A)  Anesthesia type: Epidural  Patient location: Mother/Baby  Post pain: Pain level controlled  Post assessment: Post-op Vital signs reviewed  Last Vitals:  Filed Vitals:   10/11/12 1614  BP:   Pulse:   Temp: 36.6 C  Resp: 18    Post vital signs: Reviewed  Level of consciousness:alert  Complications: No apparent anesthesia complications

## 2012-10-12 MED ORDER — HYDROCHLOROTHIAZIDE 50 MG PO TABS
50.0000 mg | ORAL_TABLET | Freq: Every day | ORAL | Status: DC
  Administered 2012-10-12 – 2012-10-13 (×2): 50 mg via ORAL
  Filled 2012-10-12: qty 2
  Filled 2012-10-12 (×2): qty 1

## 2012-10-12 MED ORDER — CLINDAMYCIN HCL 300 MG PO CAPS
300.0000 mg | ORAL_CAPSULE | Freq: Four times a day (QID) | ORAL | Status: DC
  Administered 2012-10-12 – 2012-10-13 (×4): 300 mg via ORAL
  Filled 2012-10-12 (×8): qty 1

## 2012-10-12 NOTE — Progress Notes (Signed)
Patient ID: Darlene Black, female   DOB: 04/14/1976, 36 y.o.   MRN: [id number] Postpartum day 2 Blood pressures all normal she'll is 2+ edema no complaints EDC magnesium sulfate today and started on hydrochlorothiazide 50 mg by mouth daily and she can be transferred from the unit in stable

## 2012-10-13 LAB — WOUND CULTURE: Gram Stain: NONE SEEN

## 2012-10-13 LAB — TYPE AND SCREEN: Unit division: 0

## 2012-10-13 NOTE — Progress Notes (Signed)
Patient ID: Darlene Black, female   DOB: 17-Sep-1976, 36 y.o.   MRN: [id number] Postop day 3 Vital signs normal Incision clean and dry 1+ edema No complaints she'll be discharged today on Cleocin 150 every 6 hours for 5 days hydrochlorothiazide 50 mg by mouth daily Tylox for pain to see me in one week for blood pressure check

## 2012-10-13 NOTE — Discharge Summary (Signed)
Obstetric Discharge Summary Reason for Admission: onset of labor Prenatal Procedures: none Intrapartum Procedures: cesarean: low cervical, transverse Postpartum Procedures: antibiotics Complications-Operative and Postpartum: none Hemoglobin  Date Value Range Status  10/11/2012 10.4* 12.0 - 15.0 g/dL Final     HCT  Date Value Range Status  10/11/2012 29.0* 36.0 - 46.0 % Final    Physical Exam:  General: alert Lochia: appropriate Uterine Fundus: firm Incision: healing well DVT Evaluation: No evidence of DVT seen on physical exam.  Discharge Diagnoses: Term Pregnancy-delivered  Discharge Information: Date: 10/13/2012 Activity: pelvic rest Diet: routine Medications: Percocet Condition: stable Instructions: refer to practice specific booklet Discharge to: home Follow-up Information    Call in 7 days to follow up.   Contact information:   b marshall         Newborn Data: Live born female  Birth Weight: 6 lb 14.1 oz (3120 g) APGAR: 7, 9  Home with mother.  MARSHALL,BERNARD A 10/13/2012, 6:47 AM

## 2012-10-17 LAB — ANAEROBIC CULTURE

## 2013-01-03 ENCOUNTER — Other Ambulatory Visit: Payer: Self-pay | Admitting: Obstetrics

## 2013-01-10 NOTE — H&P (Unsigned)
NAME:  ELHAM, FINI NO.:  [id number]  MEDICAL RECORD NO.:  [id number]  LOCATION:                                 FACILITY:  PHYSICIAN:  Kathreen Cosier, M.D.DATE OF BIRTH:  1976-11-06  DATE OF ADMISSION: DATE OF DISCHARGE:                             HISTORY & PHYSICAL   HISTORY:  The patient is a 37 year old, gravida 2, para 2-0-0-2, desires sterilization.  Understands procedure can fail resulting in pregnancy in tube or uterus.  PAST MEDICAL HISTORY:  She had a history of hypertension, not presently on any medication.  Normal blood pressure post delivery.  PAST SURGICAL HISTORY:  She had neck surgery at C5-C6 in 2011.  SOCIAL HISTORY:  Negative.  FAMILY HISTORY:  Negative.  SYSTEM REVIEW:  Noncontributory.  PHYSICAL EXAMINATION:  GENERAL:  Revealed a well-developed female, in no distress. HEENT:  Negative. LUNGS:  Clear to P and A. HEART:  Regular rhythm.  No murmurs, no gallops. ABDOMEN:  Negative.  No distention, no masses. PELVIC:  Uterus top normal size.  Negative adnexa.  External genitalia within normal limits.  EXTREMITIES:  Negative.          ______________________________ Kathreen Cosier, M.D.     BAM/MEDQ  D:  01/09/2013  T:  01/10/2013  Job:  [id number]

## 2013-01-17 ENCOUNTER — Encounter (HOSPITAL_COMMUNITY)
Admission: RE | Admit: 2013-01-17 | Discharge: 2013-01-17 | Disposition: A | Discharge status: Home / Self Care | Payer: Medicaid Other | Source: Ambulatory Visit | Attending: Obstetrics | Admitting: Obstetrics

## 2013-01-17 ENCOUNTER — Encounter (HOSPITAL_COMMUNITY): Payer: Self-pay

## 2013-01-17 LAB — DIFFERENTIAL
Basophils Relative: 0 % (ref 0–1)
Lymphocytes Relative: 41 % (ref 12–46)
Lymphs Abs: 1.3 10*3/uL (ref 0.7–4.0)
Monocytes Relative: 14 % — ABNORMAL HIGH (ref 3–12)
Neutro Abs: 1.4 10*3/uL — ABNORMAL LOW (ref 1.7–7.7)
Neutrophils Relative %: 43 % (ref 43–77)

## 2013-01-17 LAB — CBC
Hemoglobin: 11.3 g/dL — ABNORMAL LOW (ref 12.0–15.0)
RBC: 3.95 MIL/uL (ref 3.87–5.11)
WBC: 3.2 10*3/uL — ABNORMAL LOW (ref 4.0–10.5)

## 2013-01-17 LAB — SURGICAL PCR SCREEN
MRSA, PCR: NEGATIVE
Staphylococcus aureus: POSITIVE — AB

## 2013-01-17 NOTE — Patient Instructions (Addendum)
   Your procedure is scheduled on:Wednesday February 5th  Enter through the [employer] of [facility] at:6am Pick up the phone at the desk and dial [phone] and inform us of your arrival.  Please call this number if you have any problems the morning of surgery: [phone]  Remember: Do not eat or drink anything after midnight tonight   Do not wear jewelry, make-up, or FINGER nail polish No metal in your hair or on your body. Do not wear lotions, powders, perfumes. You may wear deodorant.  Please use your CHG wash as directed prior to surgery.  Do not shave anywhere for at least 12 hours prior to first CHG shower.  Do not bring valuables to the hospital. Please bring a case for your eyeglasses to keep them protected while you are in surgery    Patients discharged on the day of surgery will not be allowed to drive home.

## 2013-01-18 ENCOUNTER — Encounter (HOSPITAL_COMMUNITY): Payer: Self-pay

## 2013-01-18 ENCOUNTER — Ambulatory Visit (HOSPITAL_COMMUNITY): Payer: Medicaid Other | Admitting: Anesthesiology

## 2013-01-18 ENCOUNTER — Encounter (HOSPITAL_COMMUNITY)
Admission: RE | Disposition: A | Discharge status: Home / Self Care | Payer: Self-pay | Source: Ambulatory Visit | Attending: Obstetrics

## 2013-01-18 ENCOUNTER — Ambulatory Visit (HOSPITAL_COMMUNITY)
Admission: RE | Admit: 2013-01-18 | Discharge: 2013-01-18 | Disposition: A | Discharge status: Home / Self Care | Payer: Medicaid Other | Source: Ambulatory Visit | Attending: Obstetrics | Admitting: Obstetrics

## 2013-01-18 ENCOUNTER — Encounter (HOSPITAL_COMMUNITY): Payer: Self-pay | Admitting: Anesthesiology

## 2013-01-18 DIAGNOSIS — Z01818 Encounter for other preprocedural examination: Secondary | ICD-10-CM | POA: Insufficient documentation

## 2013-01-18 DIAGNOSIS — Z641 Problems related to multiparity: Secondary | ICD-10-CM | POA: Insufficient documentation

## 2013-01-18 DIAGNOSIS — Z302 Encounter for sterilization: Secondary | ICD-10-CM | POA: Insufficient documentation

## 2013-01-18 DIAGNOSIS — Z01812 Encounter for preprocedural laboratory examination: Secondary | ICD-10-CM | POA: Insufficient documentation

## 2013-01-18 HISTORY — PX: LAPAROSCOPIC TUBAL LIGATION: SHX1937

## 2013-01-18 SURGERY — LIGATION, FALLOPIAN TUBE, LAPAROSCOPIC
Anesthesia: General | Site: Abdomen | Laterality: Bilateral | Wound class: Clean Contaminated

## 2013-01-18 MED ORDER — SUCCINYLCHOLINE CHLORIDE 20 MG/ML IJ SOLN
INTRAMUSCULAR | Status: AC
  Filled 2013-01-18: qty 10

## 2013-01-18 MED ORDER — LIDOCAINE HCL (CARDIAC) 20 MG/ML IV SOLN
INTRAVENOUS | Status: AC
  Filled 2013-01-18: qty 5

## 2013-01-18 MED ORDER — FENTANYL CITRATE 0.05 MG/ML IJ SOLN
INTRAMUSCULAR | Status: AC
  Filled 2013-01-18: qty 5

## 2013-01-18 MED ORDER — FENTANYL CITRATE 0.05 MG/ML IJ SOLN
INTRAMUSCULAR | Status: AC
  Administered 2013-01-18: 50 ug via INTRAVENOUS
  Filled 2013-01-18: qty 2

## 2013-01-18 MED ORDER — LACTATED RINGERS IV SOLN
INTRAVENOUS | Status: DC
  Administered 2013-01-18: 07:00:00 via INTRAVENOUS

## 2013-01-18 MED ORDER — ONDANSETRON HCL 4 MG/2ML IJ SOLN
INTRAMUSCULAR | Status: AC
  Filled 2013-01-18: qty 2

## 2013-01-18 MED ORDER — MIDAZOLAM HCL 5 MG/5ML IJ SOLN
INTRAMUSCULAR | Status: DC | PRN
  Administered 2013-01-18: 2 mg via INTRAVENOUS

## 2013-01-18 MED ORDER — ROCURONIUM BROMIDE 50 MG/5ML IV SOLN
INTRAVENOUS | Status: AC
  Filled 2013-01-18: qty 1

## 2013-01-18 MED ORDER — ONDANSETRON HCL 4 MG/2ML IJ SOLN
INTRAMUSCULAR | Status: DC | PRN
  Administered 2013-01-18: 4 mg via INTRAVENOUS

## 2013-01-18 MED ORDER — LACTATED RINGERS IV SOLN
INTRAVENOUS | Status: DC | PRN
  Administered 2013-01-18 (×2): via INTRAVENOUS

## 2013-01-18 MED ORDER — ONDANSETRON HCL 4 MG/2ML IJ SOLN: INTRAMUSCULAR | Status: DC | PRN

## 2013-01-18 MED ORDER — KETOROLAC TROMETHAMINE 30 MG/ML IJ SOLN
INTRAMUSCULAR | Status: DC | PRN
  Administered 2013-01-18: 30 mg via INTRAVENOUS

## 2013-01-18 MED ORDER — ROCURONIUM BROMIDE 100 MG/10ML IV SOLN
INTRAVENOUS | Status: DC | PRN
  Administered 2013-01-18: 5 mg via INTRAVENOUS

## 2013-01-18 MED ORDER — KETOROLAC TROMETHAMINE 30 MG/ML IJ SOLN
INTRAMUSCULAR | Status: AC
  Filled 2013-01-18: qty 1

## 2013-01-18 MED ORDER — DEXAMETHASONE SODIUM PHOSPHATE 4 MG/ML IJ SOLN
INTRAMUSCULAR | Status: DC | PRN
  Administered 2013-01-18: 10 mg via INTRAVENOUS

## 2013-01-18 MED ORDER — FENTANYL CITRATE 0.05 MG/ML IJ SOLN
INTRAMUSCULAR | Status: DC | PRN
  Administered 2013-01-18 (×4): 50 ug via INTRAVENOUS

## 2013-01-18 MED ORDER — LIDOCAINE HCL (CARDIAC) 20 MG/ML IV SOLN
INTRAVENOUS | Status: DC | PRN
  Administered 2013-01-18: 30 mg via INTRAVENOUS
  Administered 2013-01-18: 40 mg via INTRAVENOUS
  Administered 2013-01-18: 30 mg via INTRAVENOUS

## 2013-01-18 MED ORDER — DEXAMETHASONE SODIUM PHOSPHATE 10 MG/ML IJ SOLN
INTRAMUSCULAR | Status: AC
  Filled 2013-01-18: qty 1

## 2013-01-18 MED ORDER — PROPOFOL 10 MG/ML IV EMUL
INTRAVENOUS | Status: AC
  Filled 2013-01-18: qty 20

## 2013-01-18 MED ORDER — MIDAZOLAM HCL 2 MG/2ML IJ SOLN
INTRAMUSCULAR | Status: AC
  Filled 2013-01-18: qty 2

## 2013-01-18 MED ORDER — FENTANYL CITRATE 0.05 MG/ML IJ SOLN
25.0000 ug | INTRAMUSCULAR | Status: DC | PRN
  Administered 2013-01-18 (×2): 50 ug via INTRAVENOUS

## 2013-01-18 MED ORDER — SUCCINYLCHOLINE CHLORIDE 20 MG/ML IJ SOLN
INTRAMUSCULAR | Status: DC | PRN
  Administered 2013-01-18: 80 mg via INTRAVENOUS
  Administered 2013-01-18: 120 mg via INTRAVENOUS

## 2013-01-18 MED ORDER — PROPOFOL 10 MG/ML IV EMUL
INTRAVENOUS | Status: DC | PRN
  Administered 2013-01-18: 180 mg via INTRAVENOUS
  Administered 2013-01-18: 20 mg via INTRAVENOUS

## 2013-01-18 SURGICAL SUPPLY — 13 items
CATH ROBINSON RED A/P 16FR (CATHETERS) ×2 IMPLANT
CLOTH BEACON ORANGE TIMEOUT ST (SAFETY) ×2 IMPLANT
DERMABOND ADVANCED (GAUZE/BANDAGES/DRESSINGS) ×1
DERMABOND ADVANCED .7 DNX12 (GAUZE/BANDAGES/DRESSINGS) ×1 IMPLANT
GLOVE BIO SURGEON STRL SZ8.5 (GLOVE) ×4 IMPLANT
GOWN PREVENTION PLUS LG XLONG (DISPOSABLE) ×2 IMPLANT
GOWN PREVENTION PLUS XXLARGE (GOWN DISPOSABLE) ×2 IMPLANT
PACK LAPAROSCOPY BASIN (CUSTOM PROCEDURE TRAY) ×2 IMPLANT
SUT MON AB 4-0 PS1 27 (SUTURE) ×2 IMPLANT
SUT VIC AB 0 CT1 27 (SUTURE) ×1
SUT VIC AB 0 CT1 27XBRD ANBCTR (SUTURE) ×1 IMPLANT
TOWEL OR 17X24 6PK STRL BLUE (TOWEL DISPOSABLE) ×4 IMPLANT
WATER STERILE IRR 1000ML POUR (IV SOLUTION) ×2 IMPLANT

## 2013-01-18 NOTE — Transfer of Care (Signed)
Immediate Anesthesia Transfer of Care Note  Patient: Darlene Black  Procedure(s) Performed: Procedure(s) (LRB) with comments: LAPAROSCOPIC TUBAL LIGATION (Bilateral)  Patient Location: PACU  Anesthesia Type:General  Level of Consciousness: awake, alert  and oriented  Airway & Oxygen Therapy: Patient Spontanous Breathing and Patient connected to nasal cannula oxygen  Post-op Assessment: Report given to PACU RN and Post -op Vital signs reviewed and stable  Post vital signs: Reviewed and stable  Complications: No apparent anesthesia complications

## 2013-01-18 NOTE — Op Note (Signed)
And preop diagnosis multiparity Postop diagnosis same Procedure laparoscopic tubal sterilization Anesthesia Gen. Surgeon Dr. Francoise Ceo Procedure patient placed on the operating table in the lithotomy position abdomen perineum and vagina prepped and draped bladder emptied with a straight catheter speculum placed in the vagina and the cervix grasped with a Hulka tenaculum in the umbilicus a transverse incision made carried down to the fascia fascia cleaned grasped to kocher clamps and the fascia and the peritoneum opened with the Mayo scissors the sleeve of the trocar inserted intraperitoneally visit lysing scope inserted through the sleeve of the trocar CO2  Infused  in the peritoneal cavity uterus is normal size 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

## 2013-01-18 NOTE — Anesthesia Preprocedure Evaluation (Signed)
Anesthesia Evaluation  Patient identified by MRN, date of birth, ID band Patient awake    Reviewed: Allergy & Precautions, H&P , Patient's Chart, lab work & pertinent test results, reviewed documented beta blocker date and time   History of Anesthesia Complications Negative for: history of anesthetic complications  Airway Mallampati: III TM Distance: >3 FB Neck ROM: full    Dental No notable dental hx.    Pulmonary neg pulmonary ROS,  breath sounds clear to auscultation  Pulmonary exam normal       Cardiovascular Exercise Tolerance: Good negative cardio ROS  Rhythm:regular Rate:Normal     Neuro/Psych negative neurological ROS  negative psych ROS   GI/Hepatic negative GI ROS, Neg liver ROS,   Endo/Other  negative endocrine ROSMorbid obesity  Renal/GU negative Renal ROS     Musculoskeletal   Abdominal   Peds  Hematology negative hematology ROS (+)   Anesthesia Other Findings   Reproductive/Obstetrics negative OB ROS                           Anesthesia Physical Anesthesia Plan  ASA: III  Anesthesia Plan: General ETT   Post-op Pain Management:    Induction:   Airway Management Planned:   Additional Equipment:   Intra-op Plan:   Post-operative Plan:   Informed Consent: I have reviewed the patients History and Physical, chart, labs and discussed the procedure including the risks, benefits and alternatives for the proposed anesthesia with the patient or authorized representative who has indicated his/her understanding and acceptance.   Dental Advisory Given  Plan Discussed with: CRNA and Surgeon  Anesthesia Plan Comments:         Anesthesia Quick Evaluation

## 2013-01-18 NOTE — H&P (Signed)
There has been no change in her history and physical sister region of this dictation

## 2013-01-20 ENCOUNTER — Encounter (HOSPITAL_COMMUNITY): Payer: Self-pay | Admitting: Obstetrics

## 2013-01-23 NOTE — Anesthesia Postprocedure Evaluation (Signed)
  Anesthesia Post-op Note  Patient: Darlene Black  Procedure(s) Performed: Procedure(s): LAPAROSCOPIC TUBAL LIGATION (Bilateral) Patient is awake and responsive. Pain and nausea are reasonably well controlled. Vital signs are stable and clinically acceptable. Oxygen saturation is clinically acceptable. There are no apparent anesthetic complications at this time. Patient is ready for discharge.

## 2013-03-30 ENCOUNTER — Emergency Department (HOSPITAL_COMMUNITY)
Admission: EM | Admit: 2013-03-30 | Discharge: 2013-03-30 | Disposition: A | Discharge status: Home / Self Care | Payer: Medicaid Other | Source: Home / Self Care | Attending: Family Medicine | Admitting: Family Medicine

## 2013-03-30 ENCOUNTER — Encounter (HOSPITAL_COMMUNITY): Payer: Self-pay | Admitting: Emergency Medicine

## 2013-03-30 DIAGNOSIS — J309 Allergic rhinitis, unspecified: Secondary | ICD-10-CM

## 2013-03-30 DIAGNOSIS — I10 Essential (primary) hypertension: Secondary | ICD-10-CM

## 2013-03-30 LAB — POCT I-STAT, CHEM 8
Creatinine, Ser: 0.8 mg/dL (ref 0.50–1.10)
Glucose, Bld: 74 mg/dL (ref 70–99)
HCT: 34 % — ABNORMAL LOW (ref 36.0–46.0)
Hemoglobin: 11.6 g/dL — ABNORMAL LOW (ref 12.0–15.0)
TCO2: 27 mmol/L (ref 0–100)

## 2013-03-30 MED ORDER — TRAMADOL HCL 50 MG PO TABS: 50.0000 mg | ORAL_TABLET | Freq: Four times a day (QID) | ORAL | Status: DC | PRN

## 2013-03-30 MED ORDER — FLUTICASONE PROPIONATE 50 MCG/ACT NA SUSP: 2.0000 | Freq: Every day | NASAL | Status: DC

## 2013-03-30 MED ORDER — LISINOPRIL-HYDROCHLOROTHIAZIDE 10-12.5 MG PO TABS: 1.0000 | ORAL_TABLET | Freq: Every day | ORAL | Status: DC

## 2013-03-30 MED ORDER — CETIRIZINE HCL 10 MG PO TABS: 10.0000 mg | ORAL_TABLET | Freq: Every day | ORAL | Status: DC

## 2013-03-30 NOTE — ED Notes (Signed)
Pt c/o sinus pressure and pain. Chest congestion with a productive cough. Runny nose.  Pt has used otc meds with no relief in symptoms.  Denies fever, n/v/d

## 2013-04-02 NOTE — ED Provider Notes (Signed)
History     CSN: [id number]  Arrival date & time 03/30/13  1634   First MD Initiated Contact with Patient 03/30/13 1717      Chief Complaint  Patient presents with  . URI    sinus pressure and pain. productive cough. congestion. runny nose and headache x 2 days.     (Consider location/radiation/quality/duration/timing/severity/associated sxs/prior treatment) HPI Comments: 37 y/o female with h/o HTN (not taking her meds currently) States her "blood pressure medications were not restarted after recent delivery 6 months ago". Here c/o nasal congestion, clear rhinorrhea and sinus pressure for about 3 days. Denies fever or chills, has had intermittent headache. No nausea or vomiting. No visual changes, no chest pain or leg swelling. S/p BTL and no longer breast feeding. Will like refill on her BP medication. Needs to find a PCP.    Past Medical History  Diagnosis Date  . Kidney stone   . Fibroid   . Hypertension     had been on lisinopril prior to pregnacy 2013-was d/c and has not been restarted on anything    Past Surgical History  Procedure Laterality Date  . Spine surgery  February 2011    C6 metal bracing   . Cesarean section  10/10/2012    Procedure: CESAREAN SECTION;  Surgeon: Kathreen Cosier, MD;  Location: [facility];  Service: Obstetrics;  Laterality: N/A;  . Laparoscopic tubal ligation  01/18/2013    Procedure: LAPAROSCOPIC TUBAL LIGATION;  Surgeon: Kathreen Cosier, MD;  Location: [facility];  Service: Gynecology;  Laterality: Bilateral;    Family History  Problem Relation Age of Onset  . Anesthesia problems Neg Hx   . Other Neg Hx   . Hypertension Mother   . Heart disease Mother   . Arthritis Mother   . Hyperlipidemia Mother   . Hypertension Maternal Grandmother   . Heart disease Maternal Grandmother   . Arthritis Maternal Grandmother   . Hypertension Maternal Grandfather   . Heart disease Maternal Grandfather     History  Substance Use Topics  . Smoking  status: Former Smoker -- 1.00 packs/day for 20 years    Types: Cigarettes    Quit date: 01/28/2012  . Smokeless tobacco: Not on file  . Alcohol Use: No    OB History   Grav Para Term Preterm Abortions TAB SAB Ect Mult Living   2 2 2  0 0 0 0 0 0 2      Review of Systems  Constitutional: Negative for fever, chills, diaphoresis, appetite change and fatigue.  HENT: Positive for congestion, rhinorrhea, sneezing and sinus pressure. Negative for ear pain, sore throat, neck pain and neck stiffness.   Eyes: Positive for itching. Negative for visual disturbance.  Gastrointestinal: Negative for nausea, vomiting, abdominal pain and diarrhea.  Genitourinary: Negative for dysuria, frequency, hematuria and flank pain.  Neurological: Positive for headaches. Negative for dizziness.  All other systems reviewed and are negative.    Allergies  Review of patient's allergies indicates no known allergies.  Home Medications   Current Outpatient Rx  Name  Route  Sig  Dispense  Refill  . cetirizine (ZYRTEC) 10 MG tablet   Oral   Take 1 tablet (10 mg total) by mouth daily.   30 tablet   0   . fluticasone (FLONASE) 50 MCG/ACT nasal spray   Nasal   Place 2 sprays into the nose daily.   16 g   0   . lisinopril-hydrochlorothiazide (PRINZIDE,ZESTORETIC) 10-12.5 MG per tablet  Oral   Take 1 tablet by mouth daily.   30 tablet   1   . traMADol (ULTRAM) 50 MG tablet   Oral   Take 1 tablet (50 mg total) by mouth every 6 (six) hours as needed for pain.   15 tablet   0     BP 155/99  Pulse 83  Temp(Src) 98.6 F (37 C) (Oral)  Resp 16  SpO2 100%  LMP 03/13/2013  Breastfeeding? Yes  Physical Exam  Nursing note and vitals reviewed. Constitutional: She is oriented to person, place, and time. She appears well-developed and well-nourished. No distress.  HENT:  Head: Normocephalic and atraumatic.  Nasal Congestion with erythema and swelling of nasal turbinates, clear rhinorrhea. No  pharyngeal erythema no exudates. No uvula deviation. No trismus. TM's normal.  Neck: No JVD present. No thyromegaly present.  Cardiovascular: Normal rate, regular rhythm and normal heart sounds.  Exam reveals no gallop and no friction rub.   No murmur heard. No LEE  Pulmonary/Chest: Effort normal and breath sounds normal. No respiratory distress. She has no wheezes. She has no rales. She exhibits no tenderness.  Lymphadenopathy:    She has no cervical adenopathy.  Neurological: She is alert and oriented to person, place, and time.  Skin: No rash noted. She is not diaphoretic.    ED Course  Procedures (including critical care time)  Labs Reviewed  POCT I-STAT, CHEM 8 - Abnormal; Notable for the following:    Hemoglobin 11.6 (*)    HCT 34.0 (*)    All other components within normal limits   No results found.   1. Allergic rhinosinusitis   2. Hypertension       MDM  Refilled lisinopril/HZTC (patient has taken before. Currently s/p BTL and no breastfeeding) normal creatinine and electrolytes.  Prescribed cetirizine, tramadol, flonase.  Supportive care and red flags that should prompt her return to medical attention discussed with patient and provided in writing.         Sharin Grave, MD 04/04/13 [phone]

## 2013-05-27 IMAGING — US US OB COMP LESS 14 WK
1 series · 13 of 28 positions shown · non-contrast
Comparison: 10/03/2011.

CLINICAL DATA: Ectopic pregnancy.  Spotting.  Pelvic pain.
Gestational age by last menstrual period 3 weeks 3 days.
Quantitative beta HCG 622.

OBSTETRIC <14 WK US AND TRANSVAGINAL OB US
TECHNIQUE: Both transabdominal and transvaginal ultrasound
examinations were performed for complete evaluation of the
gestation as well as the maternal uterus, adnexal regions, and
pelvic cul-de-sac.  Transvaginal technique was performed to assess
early pregnancy.

[Series 1: us ob comp less 14 wk · 0.30mm/px · 13 of 50 slices shown]
[im 2/50]
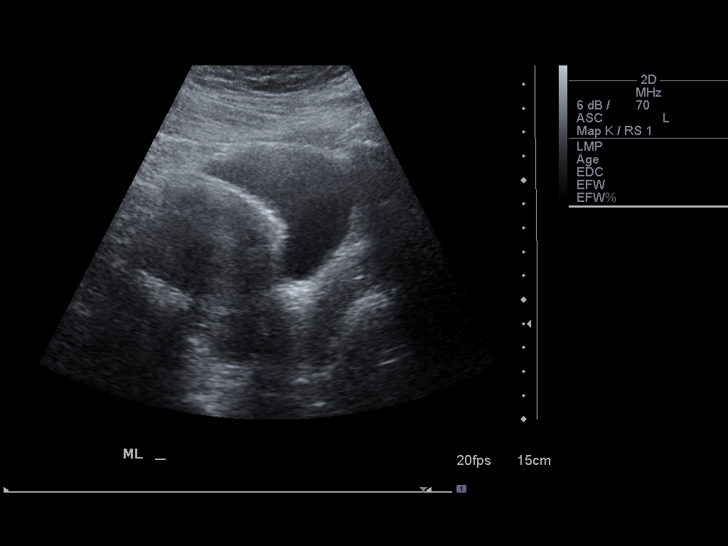
[im 6/50]
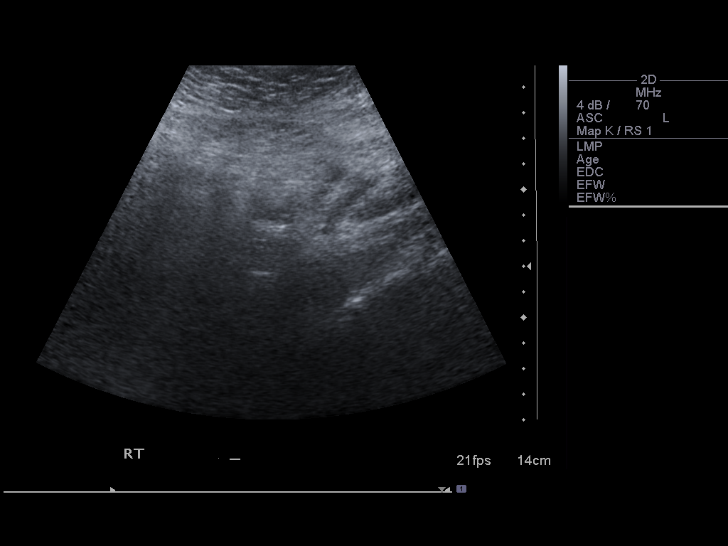
[im 10/50]
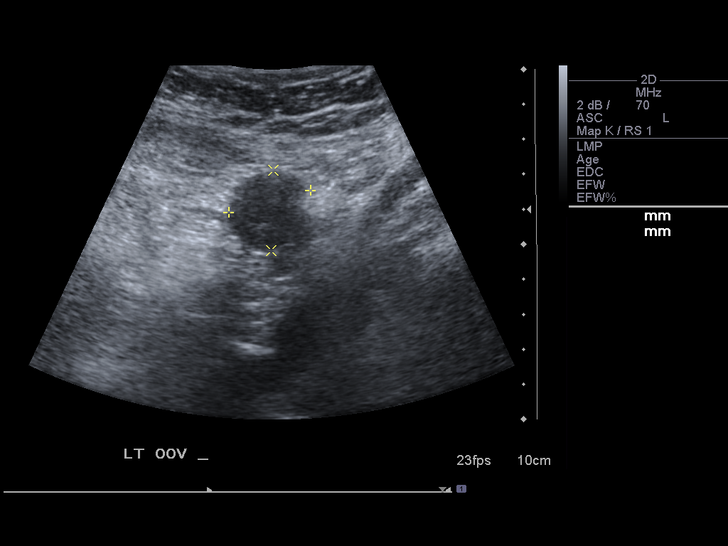
[im 13/50]
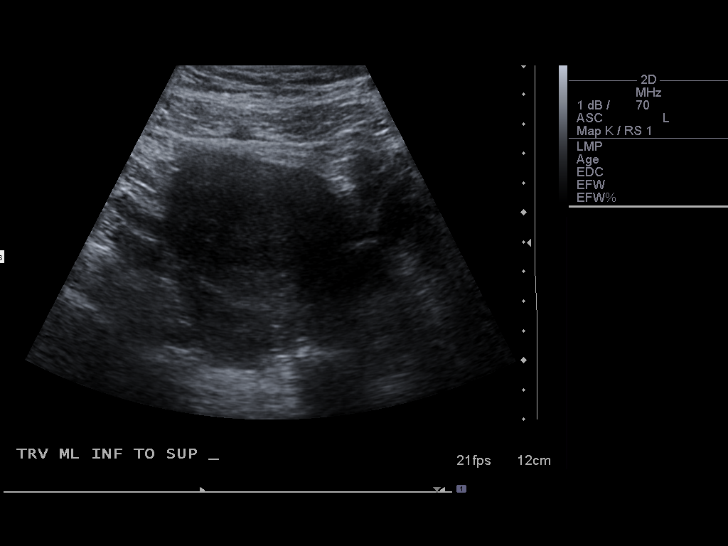
[im 17/50]
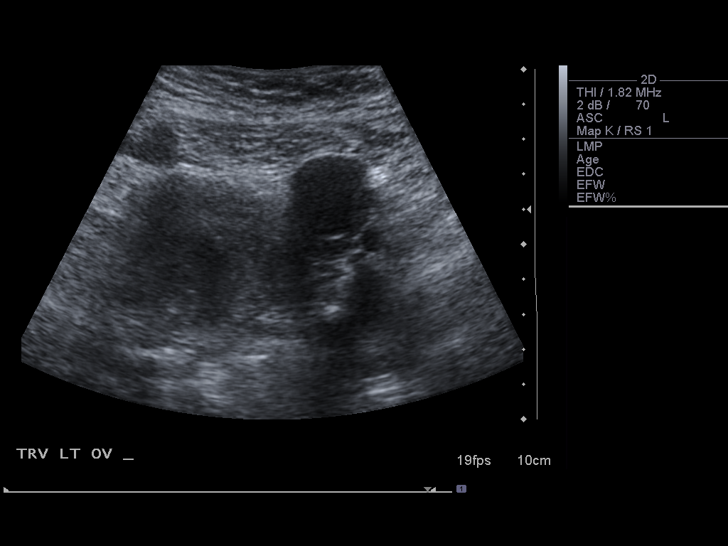
[im 20/50]
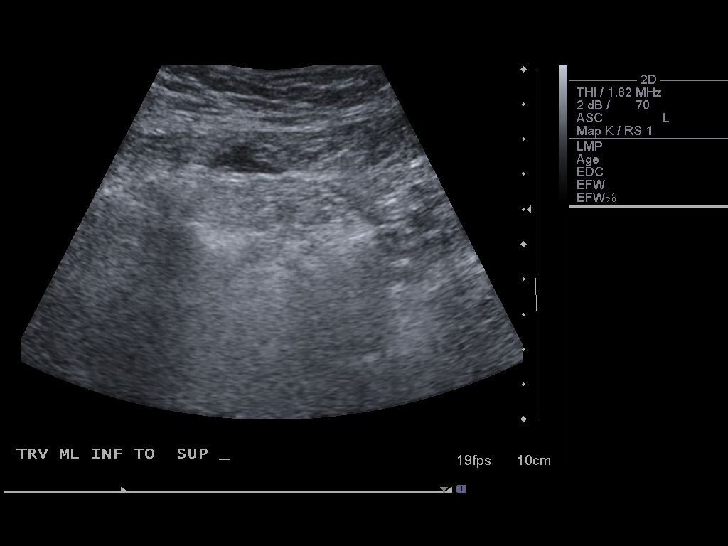
[im 26/50]
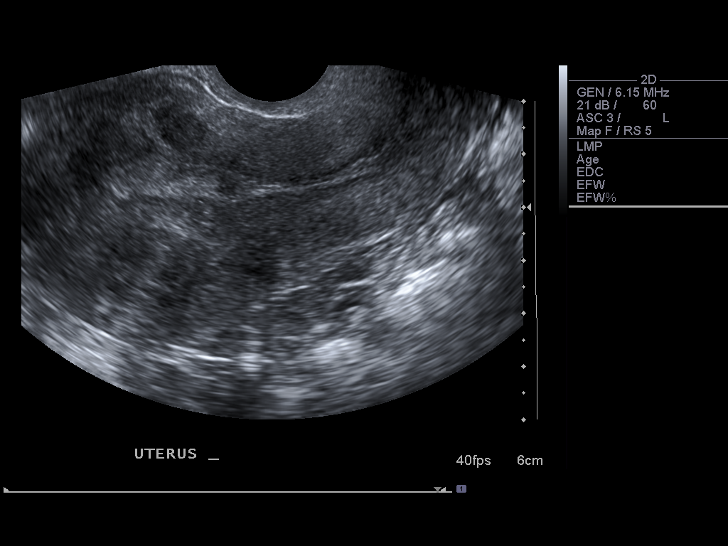
[im 30/50]
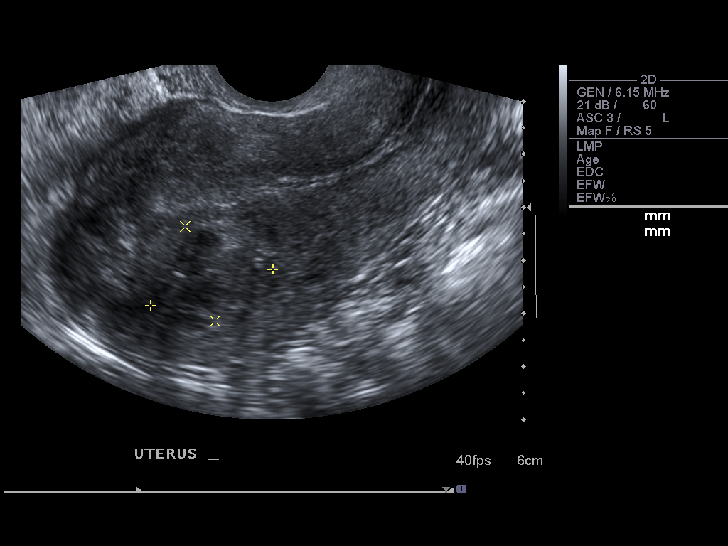
[im 33/50]
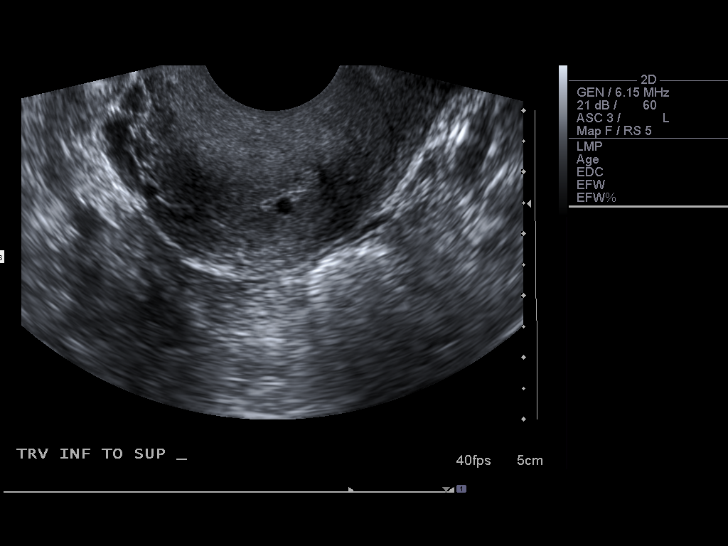
[im 37/50]
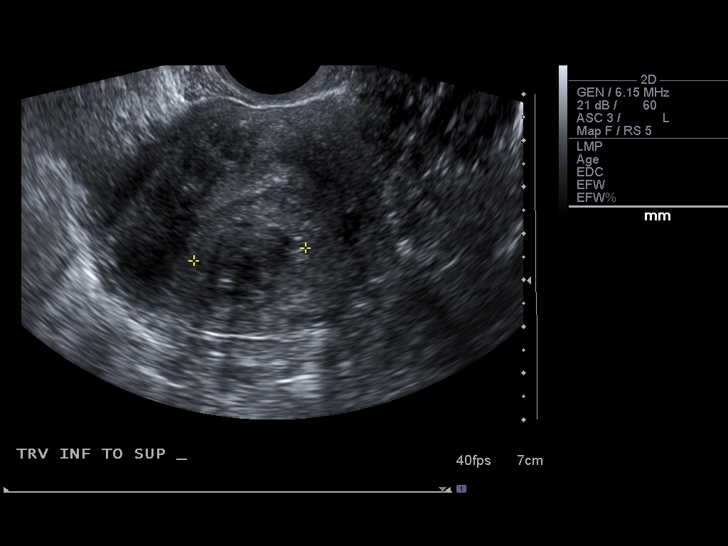
[im 40/50]
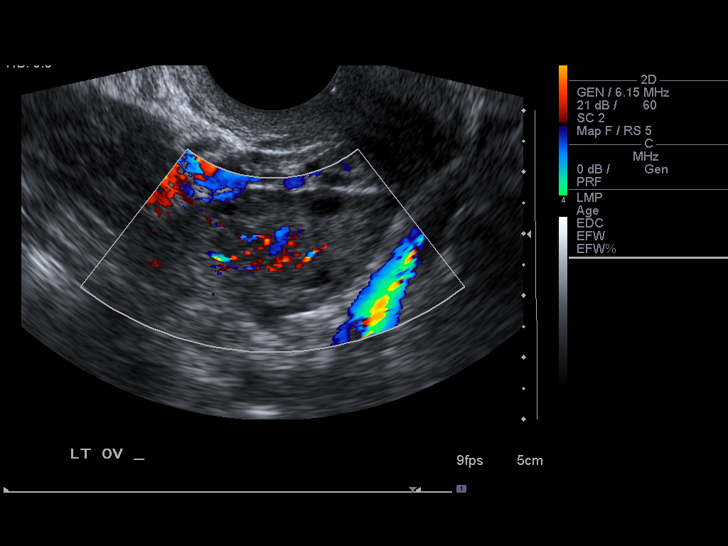
[im 44/50]
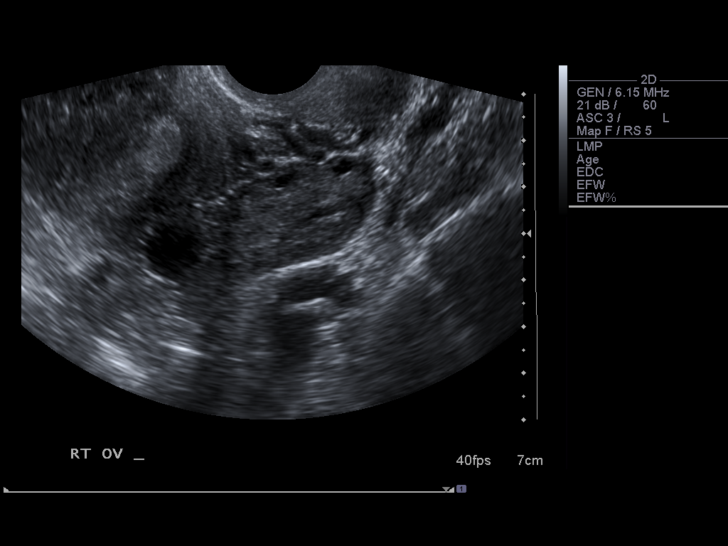
[im 48/50]
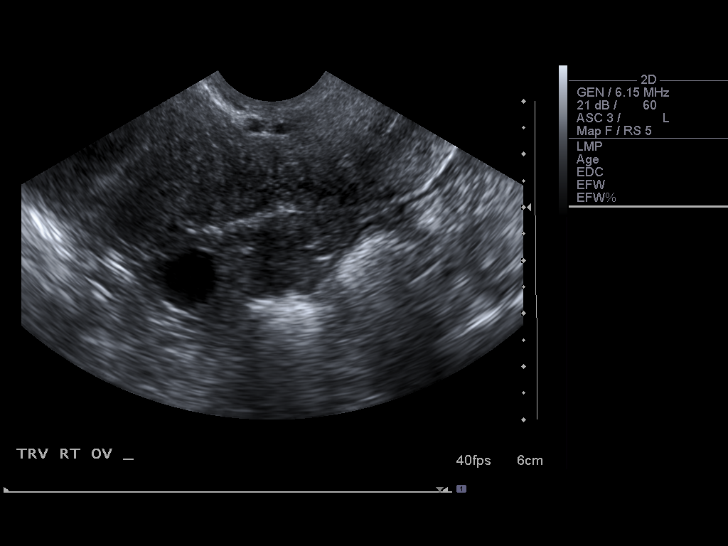

[13 of 28 positions shown; findings below may reference images not displayed]

Intrauterine gestational sac:  Not visualized.
Yolk sac: Not present
Embryo: Not present
Cardiac Activity: Not present
Heart Rate: Not applicable bpm

Maternal uterus/adnexae:
There is a hypoechoic area in the anterior uterine fundus
compatible with fibroid measuring 12 mm.  Posterior uterine fundal
hypoechoic region also is compatible with fibroids, measuring 24 mm
x 19 mm x 24 mm.

Both ovaries appear within normal limits with normal color flow.
Doppler wave forms were not ordered were performed.  There is no
color flow evidence of ovarian torsion.  12 mm follicle or cyst is
present within the right ovary.  No adnexal mass is identified.

No free fluid is present in the anatomic pelvis.
IMPRESSION: 1.  No intrauterine pregnancy identified.  With quantitative beta
HCG of 622, the differential considerations include early normal or
abnormal IUP; ectopic pregnancy is not excluded.  There is no
hemoperitoneum or adnexal mass.
2.  Consider follow-up examination in 10 days.

## 2013-07-09 ENCOUNTER — Encounter (HOSPITAL_COMMUNITY): Payer: Self-pay | Admitting: Emergency Medicine

## 2013-07-09 ENCOUNTER — Emergency Department (HOSPITAL_COMMUNITY)
Admission: EM | Admit: 2013-07-09 | Discharge: 2013-07-09 | Disposition: A | Discharge status: Home / Self Care | Payer: Medicaid Other | Attending: Emergency Medicine | Admitting: Emergency Medicine

## 2013-07-09 DIAGNOSIS — Z87891 Personal history of nicotine dependence: Secondary | ICD-10-CM | POA: Insufficient documentation

## 2013-07-09 DIAGNOSIS — M549 Dorsalgia, unspecified: Secondary | ICD-10-CM

## 2013-07-09 DIAGNOSIS — M545 Low back pain, unspecified: Secondary | ICD-10-CM | POA: Insufficient documentation

## 2013-07-09 DIAGNOSIS — Z9889 Other specified postprocedural states: Secondary | ICD-10-CM | POA: Insufficient documentation

## 2013-07-09 DIAGNOSIS — Z8742 Personal history of other diseases of the female genital tract: Secondary | ICD-10-CM | POA: Insufficient documentation

## 2013-07-09 DIAGNOSIS — I10 Essential (primary) hypertension: Secondary | ICD-10-CM | POA: Insufficient documentation

## 2013-07-09 DIAGNOSIS — Z79899 Other long term (current) drug therapy: Secondary | ICD-10-CM | POA: Insufficient documentation

## 2013-07-09 DIAGNOSIS — Z87442 Personal history of urinary calculi: Secondary | ICD-10-CM | POA: Insufficient documentation

## 2013-07-09 MED ORDER — HYDROCODONE-ACETAMINOPHEN 5-325 MG PO TABS: 2.0000 | ORAL_TABLET | ORAL | Status: DC | PRN

## 2013-07-09 MED ORDER — HYDROCODONE-ACETAMINOPHEN 5-325 MG PO TABS
2.0000 | ORAL_TABLET | Freq: Once | ORAL | Status: AC
  Administered 2013-07-09: 2 via ORAL
  Filled 2013-07-09: qty 2

## 2013-07-09 MED ORDER — IBUPROFEN 800 MG PO TABS: 800.0000 mg | ORAL_TABLET | Freq: Three times a day (TID) | ORAL | Status: DC

## 2013-07-09 NOTE — ED Notes (Signed)
Pt. Stated, i rolled over in bed and felt my back pop. Its my lower back. No injury

## 2013-07-09 NOTE — ED Provider Notes (Signed)
This chart was scribed for non-physician practitioner Trisha Mangle, PA-C working with Derwood Kaplan, MD, by Candelaria Stagers, ED Scribe. This patient was seen in room TR07C/TR07C and the patient's care was started at 5:19 PM  CSN: [id number]     Arrival date & time 07/09/13  1407 History     First MD Initiated Contact with Patient 07/09/13 1701     Chief Complaint  Patient presents with  . Back Pain    The history is provided by the patient. No language interpreter was used.   HPI Comments: Darlene Black is a 37 y.o. female who presents to the Emergency Department complaining of lower back pain that started 2 days ago and has gradually worsened.  Pt states that last night she rolled over in bed and felt a pop.  Pt reports the swelling has worsened since feeling the pop.  Pt describes the pain as burning and tingling.  She denies dysuria.  She reports radiation of pain to the right leg.  Nothing seems to make the sx better or worse.  Pt reports she finished a round of steroids last week for right leg pain.  Pt has h/o neck surgery, to C-6.    Past Medical History  Diagnosis Date  . Kidney stone   . Fibroid   . Hypertension     had been on lisinopril prior to pregnacy 2013-was d/c and has not been restarted on anything   Past Surgical History  Procedure Laterality Date  . Spine surgery  February 2011    C6 metal bracing   . Cesarean section  10/10/2012    Procedure: CESAREAN SECTION;  Surgeon: Kathreen Cosier, MD;  Location: [facility];  Service: Obstetrics;  Laterality: N/A;  . Laparoscopic tubal ligation  01/18/2013    Procedure: LAPAROSCOPIC TUBAL LIGATION;  Surgeon: Kathreen Cosier, MD;  Location: [facility];  Service: Gynecology;  Laterality: Bilateral;   Family History  Problem Relation Age of Onset  . Anesthesia problems Neg Hx   . Other Neg Hx   . Hypertension Mother   . Heart disease Mother   . Arthritis Mother   . Hyperlipidemia Mother   . Hypertension Maternal  Grandmother   . Heart disease Maternal Grandmother   . Arthritis Maternal Grandmother   . Hypertension Maternal Grandfather   . Heart disease Maternal Grandfather    History  Substance Use Topics  . Smoking status: Former Smoker -- 1.00 packs/day for 20 years    Types: Cigarettes    Quit date: 01/28/2012  . Smokeless tobacco: Not on file  . Alcohol Use: No   OB History   Grav Para Term Preterm Abortions TAB SAB Ect Mult Living   2 2 2  0 0 0 0 0 0 2     Review of Systems  Musculoskeletal: Positive for back pain (lower back pain ).  All other systems reviewed and are negative.    Allergies  Review of patient's allergies indicates no known allergies.  Home Medications   Current Outpatient Rx  Name  Route  Sig  Dispense  Refill  . Acetaminophen-Caff-Pyrilamine (MIDOL COMPLETE PO)   Oral   Take 2 tablets by mouth daily as needed (for pain).         Marland Kitchen lisinopril-hydrochlorothiazide (PRINZIDE,ZESTORETIC) 10-12.5 MG per tablet   Oral   Take 1 tablet by mouth daily.   30 tablet   1   . tiZANidine (ZANAFLEX) 4 MG tablet   Oral   Take 4 mg  by mouth every 6 (six) hours as needed (for pain).          BP 113/73  Pulse 85  Temp(Src) 98.5 F (36.9 C) (Oral)  Resp 18  Ht 5\' 3"  (1.6 m)  Wt 242 lb (109.77 kg)  BMI 42.88 kg/m2  SpO2 98%  LMP 07/08/2013 Physical Exam  Nursing note and vitals reviewed. Constitutional: She is oriented to person, place, and time. She appears well-developed and well-nourished. No distress.  HENT:  Head: Normocephalic and atraumatic.  Eyes: EOM are normal.  Neck: Neck supple. No tracheal deviation present.  Cardiovascular: Normal rate.   Pulmonary/Chest: Effort normal. No respiratory distress.  Musculoskeletal: Normal range of motion.  Diffusely tender to thoracic lumbar   Neurological: She is alert and oriented to person, place, and time.  Skin: Skin is warm and dry.  Psychiatric: She has a normal mood and affect. Her behavior is  normal.    ED Course   Procedures  DIAGNOSTIC STUDIES: Oxygen Saturation is 98% on room air, normal by my interpretation.    COORDINATION OF CARE:   5:31 PM Discussed course of care with pt which includes.  Pt understands and agrees.   Labs Reviewed - No data to display No results found. 1. Back pain     MDM   I personally performed the services in this documentation, which was scribed in my presence.  The recorded information has been reviewed and considered.   Barnet Pall.   Lonia Skinner [place], PA-C 07/09/13 1739

## 2013-07-10 NOTE — ED Provider Notes (Signed)
Medical screening examination/treatment/procedure(s) were performed by non-physician practitioner and as supervising physician I was immediately available for consultation/collaboration.  Brenyn Petrey, MD 07/10/13 0031 

## 2014-08-23 ENCOUNTER — Encounter (HOSPITAL_COMMUNITY): Payer: Self-pay | Admitting: Emergency Medicine

## 2014-08-23 ENCOUNTER — Emergency Department (HOSPITAL_COMMUNITY)
Admission: EM | Admit: 2014-08-23 | Discharge: 2014-08-23 | Disposition: A | Discharge status: Home / Self Care | Payer: Managed Care, Other (non HMO) | Attending: Emergency Medicine | Admitting: Emergency Medicine

## 2014-08-23 DIAGNOSIS — M5442 Lumbago with sciatica, left side: Secondary | ICD-10-CM

## 2014-08-23 DIAGNOSIS — Z87442 Personal history of urinary calculi: Secondary | ICD-10-CM | POA: Diagnosis not present

## 2014-08-23 DIAGNOSIS — Z9889 Other specified postprocedural states: Secondary | ICD-10-CM | POA: Insufficient documentation

## 2014-08-23 DIAGNOSIS — Z8742 Personal history of other diseases of the female genital tract: Secondary | ICD-10-CM | POA: Insufficient documentation

## 2014-08-23 DIAGNOSIS — I10 Essential (primary) hypertension: Secondary | ICD-10-CM | POA: Diagnosis not present

## 2014-08-23 DIAGNOSIS — G8929 Other chronic pain: Secondary | ICD-10-CM | POA: Insufficient documentation

## 2014-08-23 DIAGNOSIS — M542 Cervicalgia: Secondary | ICD-10-CM | POA: Insufficient documentation

## 2014-08-23 DIAGNOSIS — Z87891 Personal history of nicotine dependence: Secondary | ICD-10-CM | POA: Diagnosis not present

## 2014-08-23 DIAGNOSIS — M549 Dorsalgia, unspecified: Secondary | ICD-10-CM | POA: Insufficient documentation

## 2014-08-23 DIAGNOSIS — M5430 Sciatica, unspecified side: Secondary | ICD-10-CM | POA: Diagnosis not present

## 2014-08-23 DIAGNOSIS — R42 Dizziness and giddiness: Secondary | ICD-10-CM | POA: Diagnosis not present

## 2014-08-23 MED ORDER — PREDNISONE 20 MG PO TABS
40.0000 mg | ORAL_TABLET | Freq: Once | ORAL | Status: AC
  Administered 2014-08-23: 40 mg via ORAL
  Filled 2014-08-23: qty 2

## 2014-08-23 MED ORDER — CYCLOBENZAPRINE HCL 10 MG PO TABS
10.0000 mg | ORAL_TABLET | Freq: Once | ORAL | Status: AC
  Administered 2014-08-23: 10 mg via ORAL
  Filled 2014-08-23: qty 1

## 2014-08-23 MED ORDER — CYCLOBENZAPRINE HCL 10 MG PO TABS: 10.0000 mg | ORAL_TABLET | Freq: Two times a day (BID) | ORAL | Status: DC | PRN

## 2014-08-23 MED ORDER — PREDNISONE 10 MG PO TABS: 20.0000 mg | ORAL_TABLET | Freq: Every day | ORAL | Status: DC

## 2014-08-23 NOTE — ED Provider Notes (Signed)
CSN: [id number]     Arrival date & time 08/23/14  1535 History  This chart was scribed for non-physician practitioner, Debroah Baller, NP, working with [place], DO, by Delphia Grates, ED Scribe. This patient was seen in room TR07C/TR07C and the patient's care was started at 6:08 PM.     Chief Complaint  Patient presents with  . Back Pain     Patient is a 38 y.o. female presenting with back pain. The history is provided by the patient. No language interpreter was used.  Back Pain Location:  Lumbar spine Quality:  Burning Radiates to:  L posterior upper leg Pain severity:  Moderate Pain is:  Same all the time Onset quality:  Sudden Duration:  2 days Timing:  Constant Progression:  Unchanged Chronicity:  New Relieved by:  Nothing Worsened by:  Movement Ineffective treatments:  Ibuprofen and OTC medications Associated symptoms: no bladder incontinence, no bowel incontinence, no dysuria and no fever     HPI Comments: Darlene Black is a 38 y.o. female who presents to the Emergency Department complaining of constant, burning, stabbing, lower back pain onset 2 days ago. Patient states she was cleaning at [facility], when she felt numbness to the left side of her back. She reports the pain radiates down her left leg. Patient has history of C6 surgery in 2011 and reports chronic pain that has worsened.There is associated light-headedness. Patient notes neck pain, but states she had C6 surgery in 2011, and states this is chronic. Patient has tried ibuprofen and Tylenol without significant improvement, and states the pain is worsened with movement. She denies fever, chills, nausea, vomiting, bowel/bladder incontinence, or any urinary symptoms. Patient has history of kidney stones, fibroids, and HTN.     Past Medical History  Diagnosis Date  . Kidney stone   . Fibroid   . Hypertension     had been on lisinopril prior to pregnacy 2013-was d/c and has not been restarted on anything    Past Surgical History  Procedure Laterality Date  . Spine surgery  February 2011    C6 metal bracing   . Cesarean section  10/10/2012    Procedure: CESAREAN SECTION;  Surgeon: Frederico Hamman, MD;  Location: [facility];  Service: Obstetrics;  Laterality: N/A;  . Laparoscopic tubal ligation  01/18/2013    Procedure: LAPAROSCOPIC TUBAL LIGATION;  Surgeon: Frederico Hamman, MD;  Location: [facility];  Service: Gynecology;  Laterality: Bilateral;   Family History  Problem Relation Age of Onset  . Anesthesia problems Neg Hx   . Other Neg Hx   . Hypertension Mother   . Heart disease Mother   . Arthritis Mother   . Hyperlipidemia Mother   . Hypertension Maternal Grandmother   . Heart disease Maternal Grandmother   . Arthritis Maternal Grandmother   . Hypertension Maternal Grandfather   . Heart disease Maternal Grandfather    History  Substance Use Topics  . Smoking status: Former Smoker -- 1.00 packs/day for 20 years    Types: Cigarettes    Quit date: 01/28/2012  . Smokeless tobacco: Not on file  . Alcohol Use: No   OB History   Grav Para Term Preterm Abortions TAB SAB Ect Mult Living   2 2 2  0 0 0 0 0 0 2     Review of Systems  Constitutional: Negative for fever and chills.  Gastrointestinal: Negative for nausea, vomiting and bowel incontinence.  Genitourinary: Negative for bladder incontinence and dysuria.  Musculoskeletal: Positive  for back pain and neck pain (chronic).  Neurological: Positive for light-headedness.  All other systems negative    Allergies  Review of patient's allergies indicates no known allergies.  Home Medications   Prior to Admission medications   Medication Sig Start Date End Date Taking? Authorizing Provider  ibuprofen (ADVIL,MOTRIN) 200 MG tablet Take 400 mg by mouth every 6 (six) hours as needed for mild pain.   Yes Historical Provider, MD  lisinopril-hydrochlorothiazide (PRINZIDE,ZESTORETIC) 10-12.5 MG per tablet Take 1 tablet by mouth daily.    Yes Historical Provider, MD  tiZANidine (ZANAFLEX) 4 MG tablet Take 4 mg by mouth every 6 (six) hours as needed (for pain).   Yes Historical Provider, MD  Acetaminophen-Caff-Pyrilamine (MIDOL COMPLETE PO) Take 2 tablets by mouth daily as needed (for pain).    Historical Provider, MD   Triage Vitals: BP 146/87  Pulse 81  Temp(Src) 98.8 F (37.1 C) (Oral)  Resp 18  SpO2 96%  Physical Exam  Nursing note and vitals reviewed. Constitutional: She is oriented to person, place, and time. She appears well-developed and well-nourished. No distress.  HENT:  Head: Normocephalic and atraumatic.  Right Ear: Tympanic membrane normal.  Left Ear: Tympanic membrane normal.  Nose: Nose normal.  Mouth/Throat: Uvula is midline, oropharynx is clear and moist and mucous membranes are normal.  Eyes: EOM are normal.  Neck: Normal range of motion. Neck supple.  Cardiovascular: Normal rate and regular rhythm.   Pulmonary/Chest: Effort normal. She has no wheezes. She has no rales.  Abdominal: Soft. Bowel sounds are normal. There is no tenderness.  Musculoskeletal: Normal range of motion. She exhibits tenderness.       Lumbar back: She exhibits tenderness, pain and spasm. She exhibits normal pulse.  From the left thoracic area down to the left lumbar area there ins tenderness to palpation and ROM. Muscle spasm in left lumbar area. Tender over the left sciatic nerve.  Neurological: She is alert and oriented to person, place, and time. She has normal strength. No cranial nerve deficit or sensory deficit. Gait normal.  Reflex Scores:      Bicep reflexes are 2+ on the right side and 2+ on the left side.      Brachioradialis reflexes are 2+ on the right side and 2+ on the left side.      Patellar reflexes are 2+ on the right side and 2+ on the left side.      Achilles reflexes are 2+ on the right side and 2+ on the left side. Skin: Skin is warm and dry.  Psychiatric: She has a normal mood and affect. Her behavior  is normal.    ED Course  Procedures (including critical care time)  DIAGNOSTIC STUDIES: Oxygen Saturation is 96% on room air, normal by my interpretation.    COORDINATION OF CARE: At 1815 Discussed treatment plan with patient which includes prednisone and Flexeril. Patient agrees.   Labs Review  MDM  38 y.o. female with low back pain s/p strain while working 2 days prior to the ED visit. Stable for discharge without loss of control of bladder or bowels and without neuro deficits. She will follow up with her PCP or the orthopedic doctor if her symptoms persist. Discussed with the patient clinical findings and plan of care and all questioned fully answered. She agrees with plan.    Medication List    TAKE these medications       cyclobenzaprine 10 MG tablet  Commonly known as:  FLEXERIL  Take 1 tablet (10 mg total) by mouth 2 (two) times daily as needed for muscle spasms.     predniSONE 10 MG tablet  Commonly known as:  DELTASONE  Take 2 tablets (20 mg total) by mouth daily.      ASK your doctor about these medications       ibuprofen 200 MG tablet  Commonly known as:  ADVIL,MOTRIN  Take 400 mg by mouth every 6 (six) hours as needed for mild pain.     lisinopril-hydrochlorothiazide 10-12.5 MG per tablet  Commonly known as:  PRINZIDE,ZESTORETIC  Take 1 tablet by mouth daily.     MIDOL COMPLETE PO  Take 2 tablets by mouth daily as needed (for pain).     tiZANidine 4 MG tablet  Commonly known as:  ZANAFLEX  Take 4 mg by mouth every 6 (six) hours as needed (for pain).       I personally performed the services described in this documentation, which was scribed in my presence. The recorded information has been reviewed and is accurate.    Ashley Murrain, [place] 08/24/14 [phone]

## 2014-08-23 NOTE — Discharge Instructions (Signed)
Do not drive while taking the muscle relaxant as it will make you sleepy. °

## 2014-08-23 NOTE — ED Notes (Signed)
Pt c/o lower back pain with radiation down leg x 2 days

## 2014-08-27 NOTE — ED Provider Notes (Signed)
Medical screening examination/treatment/procedure(s) were performed by non-physician practitioner and as supervising physician I was immediately available for consultation/collaboration.   EKG Interpretation None        Delice Bison Juni Glaab, DO 08/27/14 1016

## 2014-10-15 ENCOUNTER — Encounter (HOSPITAL_COMMUNITY): Payer: Self-pay | Admitting: Emergency Medicine

## 2015-02-19 ENCOUNTER — Emergency Department (HOSPITAL_COMMUNITY)
Admission: EM | Admit: 2015-02-19 | Discharge: 2015-02-19 | Disposition: A | Discharge status: Home / Self Care | Payer: Managed Care, Other (non HMO) | Attending: Emergency Medicine | Admitting: Emergency Medicine

## 2015-02-19 ENCOUNTER — Emergency Department (HOSPITAL_COMMUNITY): Payer: Managed Care, Other (non HMO)

## 2015-02-19 ENCOUNTER — Encounter (HOSPITAL_COMMUNITY): Payer: Self-pay

## 2015-02-19 DIAGNOSIS — Y939 Activity, unspecified: Secondary | ICD-10-CM | POA: Insufficient documentation

## 2015-02-19 DIAGNOSIS — Y929 Unspecified place or not applicable: Secondary | ICD-10-CM | POA: Insufficient documentation

## 2015-02-19 DIAGNOSIS — Z8742 Personal history of other diseases of the female genital tract: Secondary | ICD-10-CM | POA: Insufficient documentation

## 2015-02-19 DIAGNOSIS — Z87442 Personal history of urinary calculi: Secondary | ICD-10-CM | POA: Insufficient documentation

## 2015-02-19 DIAGNOSIS — Z9889 Other specified postprocedural states: Secondary | ICD-10-CM | POA: Insufficient documentation

## 2015-02-19 DIAGNOSIS — S39012A Strain of muscle, fascia and tendon of lower back, initial encounter: Secondary | ICD-10-CM

## 2015-02-19 DIAGNOSIS — X58XXXA Exposure to other specified factors, initial encounter: Secondary | ICD-10-CM | POA: Insufficient documentation

## 2015-02-19 DIAGNOSIS — Z87891 Personal history of nicotine dependence: Secondary | ICD-10-CM | POA: Insufficient documentation

## 2015-02-19 DIAGNOSIS — I10 Essential (primary) hypertension: Secondary | ICD-10-CM | POA: Insufficient documentation

## 2015-02-19 DIAGNOSIS — Z7951 Long term (current) use of inhaled steroids: Secondary | ICD-10-CM | POA: Insufficient documentation

## 2015-02-19 DIAGNOSIS — Z79899 Other long term (current) drug therapy: Secondary | ICD-10-CM | POA: Insufficient documentation

## 2015-02-19 DIAGNOSIS — Y999 Unspecified external cause status: Secondary | ICD-10-CM | POA: Insufficient documentation

## 2015-02-19 MED ORDER — HYDROCODONE-ACETAMINOPHEN 5-325 MG PO TABS: 1.0000 | ORAL_TABLET | Freq: Four times a day (QID) | ORAL | Status: DC | PRN

## 2015-02-19 MED ORDER — PREDNISONE 50 MG PO TABS: 50.0000 mg | ORAL_TABLET | Freq: Every day | ORAL | Status: DC

## 2015-02-19 NOTE — ED Notes (Signed)
Pt in pain. BP elevated. Pt denies any other symptoms. Pt ambulatory on discharge. Pt discharged home with spouse.

## 2015-02-19 NOTE — ED Provider Notes (Signed)
CSN: [id number]     Arrival date & time 02/19/15  1448 History  This chart was scribed for non-physician practitioner, Irena Cords PA-C,  working with Charlesetta Shanks, MD, by Ian Bushman, ED Scribe. This patient was seen in room TR05C/TR05C and the patient's care was started at [facility] PM.   First MD Initiated Contact with Patient 02/19/15 1511     Chief Complaint  Patient presents with  . Back Pain     (Consider location/radiation/quality/duration/timing/severity/associated sxs/prior Treatment) Patient is a 39 y.o. female presenting with back pain. The history is provided by the patient. No language interpreter was used.  Back Pain Associated symptoms: no fever     HPI Comments: Darlene Black is a 39 y.o. female who presents to the Emergency Department complaining of extreme consistent back pain with a burning sensation that mildly radiates down to her leg onset yesterday.  Patient notes that regularly her back pain occurs during her menstrual cycle and thought it may be due to that but states this burning pain is very different. Patient had surgery on C6 to C7 in her neck  (Dr. Payton Mccallum) which she just finished a follow up with in February. Patient denies falling recently and has been home since September doing therapy.   Patient had cracked her vertebra c3-c5 in 2005.  She has no other complaints today.    Past Medical History  Diagnosis Date  . Kidney stone   . Fibroid   . Hypertension     had been on lisinopril prior to pregnacy 2013-was d/c and has not been restarted on anything   Past Surgical History  Procedure Laterality Date  . Spine surgery  February 2011    C6 metal bracing   . Cesarean section  10/10/2012    Procedure: CESAREAN SECTION;  Surgeon: Frederico Hamman, MD;  Location: [facility];  Service: Obstetrics;  Laterality: N/A;  . Laparoscopic tubal ligation  01/18/2013    Procedure: LAPAROSCOPIC TUBAL LIGATION;  Surgeon: Frederico Hamman, MD;  Location: [facility];  Service:  Gynecology;  Laterality: Bilateral;   Family History  Problem Relation Age of Onset  . Anesthesia problems Neg Hx   . Other Neg Hx   . Hypertension Mother   . Heart disease Mother   . Arthritis Mother   . Hyperlipidemia Mother   . Hypertension Maternal Grandmother   . Heart disease Maternal Grandmother   . Arthritis Maternal Grandmother   . Hypertension Maternal Grandfather   . Heart disease Maternal Grandfather    History  Substance Use Topics  . Smoking status: Former Smoker -- 1.00 packs/day for 20 years    Types: Cigarettes    Quit date: 01/28/2012  . Smokeless tobacco: Not on file  . Alcohol Use: No   OB History    Gravida Para Term Preterm AB TAB SAB Ectopic Multiple Living   2 2 2  0 0 0 0 0 0 2     Review of Systems  Constitutional: Negative for fever and chills.  Respiratory: Negative for cough and shortness of breath.   Gastrointestinal: Negative for nausea and vomiting.  Musculoskeletal: Positive for back pain.      Allergies  Review of patient's allergies indicates no known allergies.  Home Medications   Prior to Admission medications   Medication Sig Start Date End Date Taking? Authorizing Provider  Acetaminophen-Caff-Pyrilamine (MIDOL COMPLETE PO) Take 2 tablets by mouth daily as needed (for pain).    Historical Provider, MD  cyclobenzaprine (FLEXERIL) 10 MG  tablet Take 1 tablet (10 mg total) by mouth 2 (two) times daily as needed for muscle spasms. 08/23/14   Hope Bunnie Pion, NP  ibuprofen (ADVIL,MOTRIN) 200 MG tablet Take 400 mg by mouth every 6 (six) hours as needed for mild pain.    Historical Provider, MD  lisinopril-hydrochlorothiazide (PRINZIDE,ZESTORETIC) 10-12.5 MG per tablet Take 1 tablet by mouth daily.    Historical Provider, MD  predniSONE (DELTASONE) 10 MG tablet Take 2 tablets (20 mg total) by mouth daily. 08/23/14   [place], NP  tiZANidine (ZANAFLEX) 4 MG tablet Take 4 mg by mouth every 6 (six) hours as needed (for pain).    Historical  Provider, MD   BP 180/102 mmHg  Pulse 89  Temp(Src) 98 F (36.7 C) (Oral)  Resp 20  Ht 5\' 2"  (1.575 m)  Wt 230 lb (104.327 kg)  BMI 42.06 kg/m2  SpO2 98%  LMP 02/19/2015 Physical Exam  Constitutional: She is oriented to person, place, and time. She appears well-developed and well-nourished. No distress.  HENT:  Head: Normocephalic and atraumatic.  Mouth/Throat: Oropharynx is clear and moist.  Eyes: Pupils are equal, round, and reactive to light.  Neck: Normal range of motion. Neck supple.  Cardiovascular: Normal rate, regular rhythm and normal heart sounds.  Exam reveals no gallop and no friction rub.   No murmur heard. Pulmonary/Chest: Effort normal and breath sounds normal. No respiratory distress.  Musculoskeletal: Normal range of motion.       Lumbar back: She exhibits tenderness and pain. She exhibits normal range of motion, no bony tenderness, no swelling, no spasm and normal pulse.  Neurological: She is alert and oriented to person, place, and time.  Skin: Skin is warm and dry.  Psychiatric: She has a normal mood and affect. Her behavior is normal.  Nursing note and vitals reviewed.   ED Course  Procedures (including critical care time) DIAGNOSTIC STUDIES: Oxygen Saturation is 98% on RA, normal by my interpretation.    COORDINATION OF CARE: 3:32 PM Discussed treatment plan with patient at beside, the patient agrees with the plan and has no further questions at this time.      Imaging Review No results found.       Dalia Heading, PA-C 02/19/15 [place], PA-C 02/19/15 [id number]  Orlie Dakin, MD 02/19/15 1655

## 2015-02-19 NOTE — Discharge Instructions (Signed)
Return here as needed.  Follow-up with your primary care doctor.  Use ice and heat on your lower back

## 2015-02-19 NOTE — ED Notes (Signed)
Pt normally has lower back when she is on her cycle but her back is burning. She was on her way a therapy session this morning and it started feeling that way

## 2015-04-04 ENCOUNTER — Ambulatory Visit: Payer: Managed Care, Other (non HMO) | Admitting: Podiatry

## 2015-04-10 ENCOUNTER — Inpatient Hospital Stay (HOSPITAL_COMMUNITY)
Admission: AD | Admit: 2015-04-10 | Discharge: 2015-04-10 | Disposition: A | Discharge status: Home / Self Care | Payer: Managed Care, Other (non HMO) | Source: Ambulatory Visit | Attending: Obstetrics & Gynecology | Admitting: Obstetrics & Gynecology

## 2015-04-10 ENCOUNTER — Encounter (HOSPITAL_COMMUNITY): Payer: Self-pay | Admitting: *Deleted

## 2015-04-10 DIAGNOSIS — R109 Unspecified abdominal pain: Secondary | ICD-10-CM | POA: Insufficient documentation

## 2015-04-10 DIAGNOSIS — N390 Urinary tract infection, site not specified: Secondary | ICD-10-CM | POA: Diagnosis not present

## 2015-04-10 DIAGNOSIS — N939 Abnormal uterine and vaginal bleeding, unspecified: Secondary | ICD-10-CM | POA: Insufficient documentation

## 2015-04-10 DIAGNOSIS — I10 Essential (primary) hypertension: Secondary | ICD-10-CM | POA: Insufficient documentation

## 2015-04-10 DIAGNOSIS — F17210 Nicotine dependence, cigarettes, uncomplicated: Secondary | ICD-10-CM | POA: Insufficient documentation

## 2015-04-10 LAB — CBC
HEMATOCRIT: 30.3 % — AB (ref 36.0–46.0)
HEMOGLOBIN: 11 g/dL — AB (ref 12.0–15.0)
MCH: 29.9 pg (ref 26.0–34.0)
MCHC: 36.3 g/dL — AB (ref 30.0–36.0)
MCV: 82.3 fL (ref 78.0–100.0)
Platelets: 217 10*3/uL (ref 150–400)
RBC: 3.68 MIL/uL — ABNORMAL LOW (ref 3.87–5.11)
RDW: 12.9 % (ref 11.5–15.5)
WBC: 3.1 10*3/uL — ABNORMAL LOW (ref 4.0–10.5)

## 2015-04-10 LAB — URINALYSIS, ROUTINE W REFLEX MICROSCOPIC
Bilirubin Urine: NEGATIVE
Glucose, UA: NEGATIVE mg/dL
KETONES UR: NEGATIVE mg/dL
LEUKOCYTES UA: NEGATIVE
NITRITE: NEGATIVE
PH: 7 (ref 5.0–8.0)
PROTEIN: NEGATIVE mg/dL
SPECIFIC GRAVITY, URINE: 1.02 (ref 1.005–1.030)
Urobilinogen, UA: 2 mg/dL — ABNORMAL HIGH (ref 0.0–1.0)

## 2015-04-10 LAB — WET PREP, GENITAL
CLUE CELLS WET PREP: NONE SEEN
TRICH WET PREP: NONE SEEN
YEAST WET PREP: NONE SEEN

## 2015-04-10 LAB — URINE MICROSCOPIC-ADD ON

## 2015-04-10 LAB — POCT PREGNANCY, URINE: PREG TEST UR: NEGATIVE

## 2015-04-10 MED ORDER — MEGESTROL ACETATE 40 MG PO TABS: 40.0000 mg | ORAL_TABLET | Freq: Every day | ORAL | Status: DC

## 2015-04-10 MED ORDER — KETOROLAC TROMETHAMINE 60 MG/2ML IM SOLN
60.0000 mg | Freq: Once | INTRAMUSCULAR | Status: AC
  Administered 2015-04-10: 60 mg via INTRAMUSCULAR
  Filled 2015-04-10: qty 2

## 2015-04-10 MED ORDER — NITROFURANTOIN MONOHYD MACRO 100 MG PO CAPS: 100.0000 mg | ORAL_CAPSULE | Freq: Two times a day (BID) | ORAL | Status: DC

## 2015-04-10 MED ORDER — KETOROLAC TROMETHAMINE 10 MG PO TABS: 10.0000 mg | ORAL_TABLET | Freq: Four times a day (QID) | ORAL | Status: DC | PRN

## 2015-04-10 NOTE — MAU Provider Note (Signed)
History     CSN: [id number]  Arrival date and time: 04/10/15 1754   First Provider Initiated Contact with Patient 04/10/15 1920      Chief Complaint  Patient presents with  . Vaginal Bleeding  . Back Pain  . Abdominal Pain   HPI   [name] is a 39 y.o. female G2P2002 who presents with vaginal bleeding and abdominal pain. The vaginal bleeding started 10 days ago; she has been bleeding every day for 10 days. The bleeding is described as moderate;  She notices the blood more when she wipes. She does not have a history of abnormal cycles, however does have a history fibroids. Since she had her tubes tide, her periods have been heavier. She changes approximately 2 pads throughout the day.  She rates her pain 7/10.   Abdominal pain; the pain started 10 days ago and is located all across her lower abdomen. She has tried ibuprofen without relief. The pain is described as sharp at times and cramp like. She has never had pain like this before.   OB History    Gravida Para Term Preterm AB TAB SAB Ectopic Multiple Living   2 2 2  0 0 0 0 0 0 2      Past Medical History  Diagnosis Date  . Kidney stone   . Fibroid   . Hypertension     had been on lisinopril prior to pregnacy 2013-was d/c and has not been restarted on anything    Past Surgical History  Procedure Laterality Date  . Spine surgery  February 2011    C6 metal bracing   . Cesarean section  10/10/2012    Procedure: CESAREAN SECTION;  Surgeon: Frederico Hamman, MD;  Location: [facility];  Service: Obstetrics;  Laterality: N/A;  . Laparoscopic tubal ligation  01/18/2013    Procedure: LAPAROSCOPIC TUBAL LIGATION;  Surgeon: Frederico Hamman, MD;  Location: [facility];  Service: Gynecology;  Laterality: Bilateral;  . Tubal ligation      Family History  Problem Relation Age of Onset  . Anesthesia problems Neg Hx   . Other Neg Hx   . Hypertension Mother   . Heart disease Mother   . Arthritis Mother   . Hyperlipidemia  Mother   . Hypertension Maternal Grandmother   . Heart disease Maternal Grandmother   . Arthritis Maternal Grandmother   . Hypertension Maternal Grandfather   . Heart disease Maternal Grandfather     History  Substance Use Topics  . Smoking status: Current Every Day Smoker -- 1.00 packs/day for 20 years    Types: Cigarettes    Last Attempt to Quit: 01/28/2012  . Smokeless tobacco: Not on file  . Alcohol Use: No    Allergies: No Known Allergies  Prescriptions prior to admission  Medication Sig Dispense Refill Last Dose  . Acetaminophen-Caff-Pyrilamine (MIDOL COMPLETE PO) Take 2 tablets by mouth daily as needed (for pain).   Past Month at Unknown time  . diphenhydrAMINE (BENADRYL) 25 MG tablet Take 25 mg by mouth daily as needed for allergies.   Past Week at Unknown time  . ibuprofen (ADVIL,MOTRIN) 200 MG tablet Take 400 mg by mouth every 6 (six) hours as needed for mild pain.   Past Week at Unknown time  . cyclobenzaprine (FLEXERIL) 10 MG tablet Take 1 tablet (10 mg total) by mouth 2 (two) times daily as needed for muscle spasms. (Patient not taking: Reported on 04/10/2015) 20 tablet 0 Not Taking at Unknown time  .  HYDROcodone-acetaminophen (NORCO/VICODIN) 5-325 MG per tablet Take 1 tablet by mouth every 6 (six) hours as needed for moderate pain. (Patient not taking: Reported on 04/10/2015) 20 tablet 0 Not Taking at Unknown time  . predniSONE (DELTASONE) 50 MG tablet Take 1 tablet (50 mg total) by mouth daily. (Patient not taking: Reported on 04/10/2015) 5 tablet 0 Completed Course at Unknown time   Results for orders placed or performed during the hospital encounter of 04/10/15 (from the past 48 hour(s))  Urinalysis, Routine w reflex microscopic     Status: Abnormal   Collection Time: 04/10/15  6:10 PM  Result Value Ref Range   Color, Urine YELLOW YELLOW   APPearance CLEAR CLEAR   Specific Gravity, Urine 1.020 1.005 - 1.030   pH 7.0 5.0 - 8.0   Glucose, UA NEGATIVE NEGATIVE mg/dL    Hgb urine dipstick LARGE (A) NEGATIVE   Bilirubin Urine NEGATIVE NEGATIVE   Ketones, ur NEGATIVE NEGATIVE mg/dL   Protein, ur NEGATIVE NEGATIVE mg/dL   Urobilinogen, UA 2.0 (H) 0.0 - 1.0 mg/dL   Nitrite NEGATIVE NEGATIVE   Leukocytes, UA NEGATIVE NEGATIVE  Urine microscopic-add on     Status: Abnormal   Collection Time: 04/10/15  6:10 PM  Result Value Ref Range   Squamous Epithelial / LPF FEW (A) RARE   WBC, UA 0-2 <3 WBC/hpf   RBC / HPF 11-20 <3 RBC/hpf   Bacteria, UA MANY (A) RARE   Urine-Other MUCOUS PRESENT   Pregnancy, urine POC     Status: None   Collection Time: 04/10/15  6:27 PM  Result Value Ref Range   Preg Test, Ur NEGATIVE NEGATIVE    Comment:        THE SENSITIVITY OF THIS METHODOLOGY IS >24 mIU/mL   Wet prep, genital     Status: Abnormal   Collection Time: 04/10/15  7:20 PM  Result Value Ref Range   Yeast Wet Prep HPF POC NONE SEEN NONE SEEN   Trich, Wet Prep NONE SEEN NONE SEEN   Clue Cells Wet Prep HPF POC NONE SEEN NONE SEEN   WBC, Wet Prep HPF POC RARE (A) NONE SEEN    Comment: FEW BACTERIA SEEN  CBC     Status: Abnormal   Collection Time: 04/10/15  7:40 PM  Result Value Ref Range   WBC 3.1 (L) 4.0 - 10.5 K/uL   RBC 3.68 (L) 3.87 - 5.11 MIL/uL   Hemoglobin 11.0 (L) 12.0 - 15.0 g/dL   HCT 30.3 (L) 36.0 - 46.0 %   MCV 82.3 78.0 - 100.0 fL   MCH 29.9 26.0 - 34.0 pg   MCHC 36.3 (H) 30.0 - 36.0 g/dL   RDW 12.9 11.5 - 15.5 %   Platelets 217 150 - 400 K/uL    Review of Systems  Constitutional: Negative for fever and chills.  Gastrointestinal: Positive for abdominal pain.  Genitourinary: Negative for dysuria, urgency and frequency.  Musculoskeletal: Positive for back pain.   Physical Exam   Blood pressure 136/85, pulse 85, temperature 98.3 F (36.8 C), temperature source Oral, resp. rate 18, last menstrual period 03/31/2015.  Physical Exam  Constitutional: She is oriented to person, place, and time. She appears well-developed and well-nourished. No  distress.  HENT:  Head: Normocephalic.  Eyes: Pupils are equal, round, and reactive to light.  Neck: Neck supple.  Respiratory: Effort normal.  GI: Soft. There is generalized tenderness. There is no rigidity and no guarding.  Genitourinary:  Speculum exam: Vagina - Small amount of  dark red blood , no odor Cervix - + small amount of vaginal bleeding  Bimanual exam: Cervix closed Uterus non tender, normal size Adnexa non tender, no masses bilaterally, +suprapubic tenderness  GC/Chlam, wet prep done Chaperone present for exam.  Musculoskeletal: Normal range of motion.  Neurological: She is alert and oriented to person, place, and time.  Skin: Skin is warm. She is not diaphoretic.    MAU Course  Procedures  None  MDM Urine culture pending Toradol 60 mg IM 2035: Patient is sleeping. She was awoke, and states that her pain is better  Report given to [place] who resumes care of the patient> patient awaiting Toradol injection.   Assessment and Plan   A:  UTI Abnormal vaginal bleeding   P: DC home  [id number]       [place]        Toradol   Return to MAU as needed  Follow-up Information    Schedule an appointment as soon as possible for a visit with Frederico Hamman, MD.   Specialty:  Obstetrics and Gynecology   Contact information:   [address] [place] [address] [phone]      Mathis Bud  8:38 PM 04/10/2015    Lezlie Lye, NP 04/10/2015 7:59 PM

## 2015-04-10 NOTE — Discharge Instructions (Signed)
Abnormal Uterine Bleeding Abnormal uterine bleeding can affect women at various stages in life, including teenagers, women in their reproductive years, pregnant women, and women who have reached menopause. Several kinds of uterine bleeding are considered abnormal, including:  Bleeding or spotting between periods.   Bleeding after sexual intercourse.   Bleeding that is heavier or more than normal.   Periods that last longer than usual.  Bleeding after menopause.  Many cases of abnormal uterine bleeding are minor and simple to treat, while others are more serious. Any type of abnormal bleeding should be evaluated by your health care provider. Treatment will depend on the cause of the bleeding. HOME CARE INSTRUCTIONS Monitor your condition for any changes. The following actions may help to alleviate any discomfort you are experiencing:  Avoid the use of tampons and douches as directed by your health care provider.  Change your pads frequently. You should get regular pelvic exams and Pap tests. Keep all follow-up appointments for diagnostic tests as directed by your health care provider.  SEEK MEDICAL CARE IF:   Your bleeding lasts more than 1 week.   You feel dizzy at times.  SEEK IMMEDIATE MEDICAL CARE IF:   You pass out.   You are changing pads every 15 to 30 minutes.   You have abdominal pain.  You have a fever.   You become sweaty or weak.   You are passing large blood clots from the vagina.   You start to feel nauseous and vomit. MAKE SURE YOU:   Understand these instructions.  Will watch your condition.  Will get help right away if you are not doing well or get worse. Document Released: 11/30/2005 Document Revised: 12/05/2013 Document Reviewed: 06/29/2013 [facility] Patient Information 2015 [place], [place]. This information is not intended to replace advice given to you by your health care provider. Make sure you discuss any questions you have with your  health care provider.  Urinary Tract Infection Urinary tract infections (UTIs) can develop anywhere along your urinary tract. Your urinary tract is your body's drainage system for removing wastes and extra water. Your urinary tract includes two kidneys, two ureters, a bladder, and a urethra. Your kidneys are a pair of bean-shaped organs. Each kidney is about the size of your fist. They are located below your ribs, one on each side of your spine. CAUSES Infections are caused by microbes, which are microscopic organisms, including fungi, viruses, and bacteria. These organisms are so small that they can only be seen through a microscope. Bacteria are the microbes that most commonly cause UTIs. SYMPTOMS  Symptoms of UTIs may vary by age and gender of the patient and by the location of the infection. Symptoms in young women typically include a frequent and intense urge to urinate and a painful, burning feeling in the bladder or urethra during urination. Older women and men are more likely to be tired, shaky, and weak and have muscle aches and abdominal pain. A fever may mean the infection is in your kidneys. Other symptoms of a kidney infection include pain in your back or sides below the ribs, nausea, and vomiting. DIAGNOSIS To diagnose a UTI, your caregiver will ask you about your symptoms. Your caregiver also will ask to provide a urine sample. The urine sample will be tested for bacteria and white blood cells. White blood cells are made by your body to help fight infection. TREATMENT  Typically, UTIs can be treated with medication. Because most UTIs are caused by a bacterial infection,  they usually can be treated with the use of antibiotics. The choice of antibiotic and length of treatment depend on your symptoms and the type of bacteria causing your infection. HOME CARE INSTRUCTIONS  If you were prescribed antibiotics, take them exactly as your caregiver instructs you. Finish the medication even if you  feel better after you have only taken some of the medication.  Drink enough water and fluids to keep your urine clear or pale yellow.  Avoid caffeine, tea, and carbonated beverages. They tend to irritate your bladder.  Empty your bladder often. Avoid holding urine for long periods of time.  Empty your bladder before and after sexual intercourse.  After a bowel movement, women should cleanse from front to back. Use each tissue only once. SEEK MEDICAL CARE IF:   You have back pain.  You develop a fever.  Your symptoms do not begin to resolve within 3 days. SEEK IMMEDIATE MEDICAL CARE IF:   You have severe back pain or lower abdominal pain.  You develop chills.  You have nausea or vomiting.  You have continued burning or discomfort with urination. MAKE SURE YOU:   Understand these instructions.  Will watch your condition.  Will get help right away if you are not doing well or get worse. Document Released: 09/09/2005 Document Revised: 05/31/2012 Document Reviewed: 01/08/2012 [facility] Patient Information 2015 [place], [place]. This information is not intended to replace advice given to you by your health care provider. Make sure you discuss any questions you have with your health care provider.

## 2015-04-10 NOTE — MAU Note (Signed)
Pt states she has been bleeding for the past 10 days, started out bleeding very heavily, passing clots.  After 6 days, bleeding became less but has continued.  Lower abd & lower back pain also for 10 days.  Pt feels weak, fatigued.

## 2015-04-11 LAB — GC/CHLAMYDIA PROBE AMP (~~LOC~~) NOT AT ARMC
CHLAMYDIA, DNA PROBE: NEGATIVE
NEISSERIA GONORRHEA: NEGATIVE

## 2015-04-11 LAB — HIV ANTIBODY (ROUTINE TESTING W REFLEX): HIV SCREEN 4TH GENERATION: NONREACTIVE

## 2015-04-12 LAB — URINE CULTURE: Colony Count: 30000

## 2015-06-11 ENCOUNTER — Emergency Department (HOSPITAL_COMMUNITY)
Admission: EM | Admit: 2015-06-11 | Discharge: 2015-06-12 | Disposition: A | Discharge status: Home / Self Care | Payer: Managed Care, Other (non HMO) | Attending: Emergency Medicine | Admitting: Emergency Medicine

## 2015-06-11 DIAGNOSIS — Y939 Activity, unspecified: Secondary | ICD-10-CM | POA: Diagnosis not present

## 2015-06-11 DIAGNOSIS — Z87442 Personal history of urinary calculi: Secondary | ICD-10-CM | POA: Diagnosis not present

## 2015-06-11 DIAGNOSIS — X58XXXA Exposure to other specified factors, initial encounter: Secondary | ICD-10-CM | POA: Diagnosis not present

## 2015-06-11 DIAGNOSIS — Y999 Unspecified external cause status: Secondary | ICD-10-CM | POA: Insufficient documentation

## 2015-06-11 DIAGNOSIS — Y929 Unspecified place or not applicable: Secondary | ICD-10-CM | POA: Diagnosis not present

## 2015-06-11 DIAGNOSIS — I10 Essential (primary) hypertension: Secondary | ICD-10-CM | POA: Diagnosis not present

## 2015-06-11 DIAGNOSIS — Z720 Tobacco use: Secondary | ICD-10-CM | POA: Diagnosis not present

## 2015-06-11 DIAGNOSIS — T7840XA Allergy, unspecified, initial encounter: Secondary | ICD-10-CM | POA: Diagnosis not present

## 2015-06-11 DIAGNOSIS — Z86018 Personal history of other benign neoplasm: Secondary | ICD-10-CM | POA: Diagnosis not present

## 2015-06-11 MED ORDER — METHYLPREDNISOLONE SODIUM SUCC 125 MG IJ SOLR
125.0000 mg | Freq: Once | INTRAMUSCULAR | Status: AC
  Administered 2015-06-11: 125 mg via INTRAVENOUS
  Filled 2015-06-11: qty 2

## 2015-06-11 MED ORDER — DIPHENHYDRAMINE HCL 50 MG/ML IJ SOLN
25.0000 mg | Freq: Once | INTRAMUSCULAR | Status: AC
  Administered 2015-06-11: 25 mg via INTRAVENOUS
  Filled 2015-06-11: qty 1

## 2015-06-11 MED ORDER — FAMOTIDINE IN NACL 20-0.9 MG/50ML-% IV SOLN
20.0000 mg | Freq: Once | INTRAVENOUS | Status: AC
  Administered 2015-06-11: 20 mg via INTRAVENOUS
  Filled 2015-06-11: qty 50

## 2015-06-11 MED ORDER — DIPHENHYDRAMINE HCL 25 MG PO TABS: 25.0000 mg | ORAL_TABLET | Freq: Every day | ORAL | Status: DC | PRN

## 2015-06-11 MED ORDER — EPINEPHRINE 0.3 MG/0.3ML IJ SOAJ
0.3000 mg | Freq: Once | INTRAMUSCULAR | Status: AC
  Administered 2015-06-11: 0.3 mg via INTRAMUSCULAR

## 2015-06-11 MED ORDER — EPINEPHRINE 0.3 MG/0.3ML IJ SOAJ: 0.3000 mg | Freq: Once | INTRAMUSCULAR | Status: DC

## 2015-06-11 MED ORDER — FAMOTIDINE 20 MG PO TABS: 20.0000 mg | ORAL_TABLET | Freq: Two times a day (BID) | ORAL | Status: DC

## 2015-06-11 MED ORDER — EPINEPHRINE 0.3 MG/0.3ML IJ SOAJ
INTRAMUSCULAR | Status: AC
  Filled 2015-06-11: qty 0.3

## 2015-06-11 MED ORDER — PREDNISONE 20 MG PO TABS: ORAL_TABLET | ORAL | Status: DC

## 2015-06-11 NOTE — ED Notes (Signed)
Pt st's approx 2 hours ago she started itching all over.  Then started to break out.  Pt st's feels like her tongue is swollen

## 2015-06-11 NOTE — ED Notes (Addendum)
pts family member states that pt started itching and broke out in a rash about 3 hours ago when she was in the kitchen making food, pt started feeling like her throat was tight and her tongue felt like it was swollen and that she was having a little trouble breathing when they were in route here. Pt alert, oriented. VS WNL. nad

## 2015-06-11 NOTE — ED Notes (Signed)
PA Tran at bedside. 

## 2015-06-11 NOTE — Discharge Instructions (Signed)
You have an allergic reaction.  Take benadryl and pepcid along with steroid as prescribe for the nexst 4-5 days.  If you develop similar allergic reaction and having trouble with breathing then use epipen and return to ER promptly for further care. Allergies Allergies may happen from anything your body is sensitive to. This may be food, medicines, pollens, chemicals, and nearly anything around you in everyday life that produces allergens. An allergen is anything that causes an allergy producing substance. Heredity is often a factor in causing these problems. This means you may have some of the same allergies as your parents. Food allergies happen in all age groups. Food allergies are some of the most severe and life threatening. Some common food allergies are cow's milk, seafood, eggs, nuts, wheat, and soybeans. SYMPTOMS   Swelling around the mouth.  An itchy red rash or hives.  Vomiting or diarrhea.  Difficulty breathing. SEVERE ALLERGIC REACTIONS ARE LIFE-THREATENING. This reaction is called anaphylaxis. It can cause the mouth and throat to swell and cause difficulty with breathing and swallowing. In severe reactions only a trace amount of food (for example, peanut oil in a salad) may cause death within seconds. Seasonal allergies occur in all age groups. These are seasonal because they usually occur during the same season every year. They may be a reaction to molds, grass pollens, or tree pollens. Other causes of problems are house dust mite allergens, pet dander, and mold spores. The symptoms often consist of nasal congestion, a runny itchy nose associated with sneezing, and tearing itchy eyes. There is often an associated itching of the mouth and ears. The problems happen when you come in contact with pollens and other allergens. Allergens are the particles in the air that the body reacts to with an allergic reaction. This causes you to release allergic antibodies. Through a chain of events,  these eventually cause you to release histamine into the blood stream. Although it is meant to be protective to the body, it is this release that causes your discomfort. This is why you were given anti-histamines to feel better. If you are unable to pinpoint the offending allergen, it may be determined by skin or blood testing. Allergies cannot be cured but can be controlled with medicine. Hay fever is a collection of all or some of the seasonal allergy problems. It may often be treated with simple over-the-counter medicine such as diphenhydramine. Take medicine as directed. Do not drink alcohol or drive while taking this medicine. Check with your caregiver or package insert for child dosages. If these medicines are not effective, there are many new medicines your caregiver can prescribe. Stronger medicine such as nasal spray, eye drops, and corticosteroids may be used if the first things you try do not work well. Other treatments such as immunotherapy or desensitizing injections can be used if all else fails. Follow up with your caregiver if problems continue. These seasonal allergies are usually not life threatening. They are generally more of a nuisance that can often be handled using medicine. HOME CARE INSTRUCTIONS   If unsure what causes a reaction, keep a diary of foods eaten and symptoms that follow. Avoid foods that cause reactions.  If hives or rash are present:  Take medicine as directed.  You may use an over-the-counter antihistamine (diphenhydramine) for hives and itching as needed.  Apply cold compresses (cloths) to the skin or take baths in cool water. Avoid hot baths or showers. Heat will make a rash and itching worse.  If you are severely allergic:  Following a treatment for a severe reaction, hospitalization is often required for closer follow-up.  Wear a medic-alert bracelet or necklace stating the allergy.  You and your family must learn how to give adrenaline or use an  anaphylaxis kit.  If you have had a severe reaction, always carry your anaphylaxis kit or EpiPen with you. Use this medicine as directed by your caregiver if a severe reaction is occurring. Failure to do so could have a fatal outcome. SEEK MEDICAL CARE IF:  You suspect a food allergy. Symptoms generally happen within 30 minutes of eating a food.  Your symptoms have not gone away within 2 days or are getting worse.  You develop new symptoms.  You want to retest yourself or your child with a food or drink you think causes an allergic reaction. Never do this if an anaphylactic reaction to that food or drink has happened before. Only do this under the care of a caregiver. SEEK IMMEDIATE MEDICAL CARE IF:   You have difficulty breathing, are wheezing, or have a tight feeling in your chest or throat.  You have a swollen mouth, or you have hives, swelling, or itching all over your body.  You have had a severe reaction that has responded to your anaphylaxis kit or an EpiPen. These reactions may return when the medicine has worn off. These reactions should be considered life threatening. MAKE SURE YOU:   Understand these instructions.  Will watch your condition.  Will get help right away if you are not doing well or get worse. Document Released: 02/23/2003 Document Revised: 03/27/2013 Document Reviewed: 07/30/2008 [facility] Patient Information 2015 [place], [place]. This information is not intended to replace advice given to you by your health care provider. Make sure you discuss any questions you have with your health care provider.   Epinephrine Injection Epinephrine is a medicine given by injection to temporarily treat an emergency allergic reaction. It is also used to treat severe asthmatic attacks and other lung problems. The medicine helps to enlarge (dilate) the small breathing tubes of the lungs. A life-threatening, sudden allergic reaction that involves the whole body is called  anaphylaxis. Because of potential side effects, epinephrine should only be used as directed by your caregiver. RISKS AND COMPLICATIONS Possible side effects of epinephrine injections include:  Chest pain.  Irregular or rapid heartbeat.  Shortness of breath.  Nausea.  Vomiting.  Abdominal pain or cramping.  Sweating.  Dizziness.  Weakness.  Headache.  Nervousness. Report all side effects to your caregiver. HOW TO GIVE AN EPINEPHRINE INJECTION Give the epinephrine injection immediately when symptoms of a severe reaction begin. Inject the medicine into the outer thigh or any available, large muscle. Your caregiver can teach you how to do this. You do not need to remove any clothing. After the injection, call your local emergency services (911 in [place] Even if you improve after the injection, you need to be examined at a hospital emergency department. Epinephrine works quickly, but it also wears off quickly. Delayed reactions can occur. A delayed reaction may be as serious and dangerous as the initial reaction. HOME CARE INSTRUCTIONS  Make sure you and your family know how to give an epinephrine injection.  Use epinephrine injections as directed by your caregiver. Do not use this medicine more often or in larger doses than prescribed.  Always carry your epinephrine injection or anaphylaxis kit with you. This can be lifesaving if you have a severe reaction.  Store  the medicine in a cool, dry place. If the medicine becomes discolored or cloudy, dispose of it properly and replace it with new medicine.  Check the expiration date on your medicine. It may be unsafe to use medicines past their expiration date.  Tell your caregiver about any other medicines you are taking. Some medicines can react badly with epinephrine.  Tell your caregiver about any medical conditions you have, such as diabetes, high blood pressure (hypertension), heart disease, irregular heartbeats, or if you are  pregnant. SEEK IMMEDIATE MEDICAL CARE IF:  You have used an epinephrine injection. Call your local emergency services (911 in [place] Even if you improve after the injection, you need to be examined at a hospital emergency department to make sure your allergic reaction is under control. You will also be monitored for adverse effects from the medicine.  You have chest pain.  You have irregular or fast heartbeats.  You have shortness of breath.  You have severe headaches.  You have severe nausea, vomiting, or abdominal cramps.  You have severe pain, swelling, or redness in the area where you gave the injection. Document Released: 11/27/2000 Document Revised: 02/22/2012 Document Reviewed: 08/19/2011 [facility] Patient Information 2015 [place], [place]. This information is not intended to replace advice given to you by your health care provider. Make sure you discuss any questions you have with your health care provider.

## 2015-06-11 NOTE — ED Provider Notes (Signed)
CSN: [id number]     Arrival date & time 06/11/15  2129 History   First MD Initiated Contact with Patient 06/11/15 2201     Chief Complaint  Patient presents with  . Allergic Reaction     (Consider location/radiation/quality/duration/timing/severity/associated sxs/prior Treatment) HPI   39 year old female presents for evaluation of allergic reaction. Patient states approximately 3 hours ago she was in the kitchen making food when she begins to develop itchiness to her right elbow which she thought was a mosquito bite. Subsequently she noticed hives appearing body and now she is having some sensation of lip swelling, mild tongue discomfort, and mild shortness of breath. She denies lightheadedness, dizziness, chest pain, abdominal cramping, nausea vomiting diarrhea. She does not notice of anything specifically that cause her symptoms. She denies any prior history of allergies. No change in soap, detergent, new medication, or new pets. Her food product has been the same product that she has used in the past.  Past Medical History  Diagnosis Date  . Kidney stone   . Fibroid   . Hypertension     had been on lisinopril prior to pregnacy 2013-was d/c and has not been restarted on anything   Past Surgical History  Procedure Laterality Date  . Spine surgery  February 2011    C6 metal bracing   . Cesarean section  10/10/2012    Procedure: CESAREAN SECTION;  Surgeon: Frederico Hamman, MD;  Location: [facility];  Service: Obstetrics;  Laterality: N/A;  . Laparoscopic tubal ligation  01/18/2013    Procedure: LAPAROSCOPIC TUBAL LIGATION;  Surgeon: Frederico Hamman, MD;  Location: [facility];  Service: Gynecology;  Laterality: Bilateral;  . Tubal ligation     Family History  Problem Relation Age of Onset  . Anesthesia problems Neg Hx   . Other Neg Hx   . Hypertension Mother   . Heart disease Mother   . Arthritis Mother   . Hyperlipidemia Mother   . Hypertension Maternal Grandmother   . Heart  disease Maternal Grandmother   . Arthritis Maternal Grandmother   . Hypertension Maternal Grandfather   . Heart disease Maternal Grandfather    History  Substance Use Topics  . Smoking status: Current Every Day Smoker -- 1.00 packs/day for 20 years    Types: Cigarettes    Last Attempt to Quit: 01/28/2012  . Smokeless tobacco: Not on file  . Alcohol Use: No   OB History    Gravida Para Term Preterm AB TAB SAB Ectopic Multiple Living   2 2 2  0 0 0 0 0 0 2     Review of Systems  All other systems reviewed and are negative.     Allergies  Review of patient's allergies indicates no known allergies.  Home Medications   Prior to Admission medications   Medication Sig Start Date End Date Taking? Authorizing Provider  Acetaminophen-Caff-Pyrilamine (MIDOL COMPLETE PO) Take 2 tablets by mouth daily as needed (for pain).    Historical Provider, MD  diphenhydrAMINE (BENADRYL) 25 MG tablet Take 25 mg by mouth daily as needed for allergies.    Historical Provider, MD  ketorolac (TORADOL) 10 MG tablet Take 1 tablet (10 mg total) by mouth every 6 (six) hours as needed. 04/10/15   Tresea Mall, CNM  megestrol (MEGACE) 40 MG tablet Take 1 tablet (40 mg total) by mouth daily. Take 3 PO for 3 days. Then 2 PO for 2 days. Then 1 PO for up to one week as needed for  bleeding 04/10/15   Tresea Mall, CNM  nitrofurantoin, macrocrystal-monohydrate, (MACROBID) 100 MG capsule Take 1 capsule (100 mg total) by mouth 2 (two) times daily. 04/10/15   Tresea Mall, CNM   BP 159/92 mmHg  Pulse 78  Temp(Src) 99.6 F (37.6 C) (Oral)  Resp 22  SpO2 100% Physical Exam  Constitutional: She is oriented to person, place, and time. She appears well-developed and well-nourished. No distress.  HENT:  Head: Atraumatic.  Mouth: No tongue edema but mild edema to lower lip. No rash.  Eyes: Conjunctivae are normal.  Neck: Neck supple.  Cardiovascular: Normal rate and regular rhythm.   Pulmonary/Chest: Effort  normal and breath sounds normal. No respiratory distress. She has no wheezes. She has no rales. She exhibits no tenderness.  Abdominal: Soft. There is no tenderness.  Neurological: She is alert and oriented to person, place, and time.  Skin: Rash (Pruritic hives noted throughout the body.) noted.  Psychiatric: She has a normal mood and affect.  Nursing note and vitals reviewed.   ED Course  Procedures (including critical care time)  Patient here with an allergic reaction to an unknown substance. She is complaining of lip swelling and having some mild shortness of breath along with having hives. She is not hypoxic and vital signs is stable. Will give patient IM epinephrine, Solu-Medrol, Pepcid, Benadryl, fluids and will monitor closely.  11:49 PM After receiving treatment patient felt much better. Her hives has been improved. Lip swelling decrease. No respiratory distress. Vital signs stable. We'll continue to monitor.  12:30 AM Patients shown moderate improvement. She is stable for discharge. Return precautions discussed. Patient discharged with Benadryl, Pepcid, and prednisone. An EpiPen prescribed to use in case of anaphylactic reaction. Prompt return precaution discussed  Labs Review Labs Reviewed - No data to display  Imaging Review No results found.   EKG Interpretation None      Date: 06/11/2015  Rate: 72  Rhythm: normal sinus rhythm  QRS Axis: normal  Intervals: normal  ST/T Wave abnormalities: normal  Conduction Disutrbances: none  Narrative Interpretation:   Old EKG Reviewed: No significant changes noted     MDM   Final diagnoses:  Allergic reaction, initial encounter    BP 152/80 mmHg  Pulse 88  Temp(Src) 99.6 F (37.6 C) (Oral)  Resp 20  SpO2 100%     Domenic Moras, PA-C 06/12/15 0031  Milton Ferguson, MD 06/14/15 [phone]

## 2015-06-12 NOTE — ED Notes (Addendum)
Darlene Black, [name] bedside.

## 2015-08-28 LAB — LAB REPORT - SCANNED: Pap: NEGATIVE

## 2015-08-29 ENCOUNTER — Other Ambulatory Visit (HOSPITAL_COMMUNITY): Payer: Self-pay | Admitting: Obstetrics

## 2015-08-29 DIAGNOSIS — Z1231 Encounter for screening mammogram for malignant neoplasm of breast: Secondary | ICD-10-CM

## 2015-09-05 ENCOUNTER — Ambulatory Visit (HOSPITAL_COMMUNITY): Payer: Managed Care, Other (non HMO) | Attending: Obstetrics

## 2015-10-04 ENCOUNTER — Other Ambulatory Visit: Payer: Self-pay | Admitting: Obstetrics

## 2015-10-10 ENCOUNTER — Encounter (HOSPITAL_COMMUNITY): Payer: Self-pay | Admitting: *Deleted

## 2015-10-15 ENCOUNTER — Encounter (HOSPITAL_COMMUNITY): Payer: Self-pay | Admitting: Anesthesiology

## 2015-10-15 NOTE — H&P (Signed)
NAME:  ARELLA, BLINDER NO.:  [id number]  MEDICAL RECORD NO.:  [id number]  LOCATION:                                FACILITY:  [facility]  PHYSICIAN:  Frederico Hamman, M.D.DATE OF BIRTH:  August 01, 1976  DATE OF ADMISSION:  10/16/2015 DATE OF DISCHARGE:                             HISTORY & PHYSICAL   HISTORY OF PRESENT ILLNESS:  The patient is a 39 year old gravida 2, para 2-0-2-2, who has been having heavy periods since March with cramps and desires HTA.  PAST MEDICAL HISTORY:  She is hypertensive, not on any medication.  PAST SURGICAL HISTORY:  She had neck surgery x2 in 2011 and 2015.  She has had a C-section x1.  FAMILY HISTORY:  Her family history is negative.  SYSTEMS REVIEW:  Negative.  PHYSICAL EXAMINATION:  GENERAL:  A well-developed female in no distress. HEENT:  Negative. LUNGS:  Clear to P and A. HEART:  Regular rhythm.  No murmurs.  No gallops. BREASTS:  Negative. ABDOMEN:  Negative. UTERUS:  Top-normal size.  Negative adnexa.  Cervix clean. BIMANUAL:  Negative. PELVIC:  Negative. EXTREMITIES:  Negative.          ______________________________ Frederico Hamman, M.D.     BAM/MEDQ  D:  10/14/2015  T:  10/14/2015  Job:  [id number]

## 2015-10-15 NOTE — Anesthesia Preprocedure Evaluation (Signed)
Anesthesia Evaluation  Patient identified by MRN, date of birth, ID band Patient awake    Reviewed: Allergy & Precautions, NPO status , Patient's Chart, lab work & pertinent test results  Airway Mallampati: III  TM Distance: >3 FB Neck ROM: Full    Dental no notable dental hx. (+) Teeth Intact   Pulmonary neg pulmonary ROS, Current Smoker,    Pulmonary exam normal breath sounds clear to auscultation       Cardiovascular hypertension, Normal cardiovascular exam Rhythm:Regular Rate:Normal     Neuro/Psych negative neurological ROS  negative psych ROS   GI/Hepatic negative GI ROS, Neg liver ROS,   Endo/Other  Morbid obesity  Renal/GU Renal diseaseHx/o renal calculi   negative genitourinary   Musculoskeletal negative musculoskeletal ROS (+)   Abdominal (+) + obese,   Peds  Hematology negative hematology ROS (+)   Anesthesia Other Findings   Reproductive/Obstetrics DUB                              Anesthesia Physical Anesthesia Plan  ASA: III  Anesthesia Plan: General   Post-op Pain Management:    Induction: Intravenous  Airway Management Planned: LMA  Additional Equipment:   Intra-op Plan:   Post-operative Plan: Extubation in OR  Informed Consent: I have reviewed the patients History and Physical, chart, labs and discussed the procedure including the risks, benefits and alternatives for the proposed anesthesia with the patient or authorized representative who has indicated his/her understanding and acceptance.   Dental advisory given  Plan Discussed with: Anesthesiologist, CRNA and Surgeon  Anesthesia Plan Comments:         Anesthesia Quick Evaluation

## 2015-10-16 ENCOUNTER — Ambulatory Visit (HOSPITAL_COMMUNITY): Payer: Managed Care, Other (non HMO) | Admitting: Anesthesiology

## 2015-10-16 ENCOUNTER — Ambulatory Visit (HOSPITAL_COMMUNITY)
Admission: RE | Admit: 2015-10-16 | Discharge: 2015-10-16 | Disposition: A | Discharge status: Home / Self Care | Payer: Managed Care, Other (non HMO) | Source: Ambulatory Visit | Attending: Obstetrics | Admitting: Obstetrics

## 2015-10-16 ENCOUNTER — Encounter (HOSPITAL_COMMUNITY)
Admission: RE | Disposition: A | Discharge status: Home / Self Care | Payer: Self-pay | Source: Ambulatory Visit | Attending: Obstetrics

## 2015-10-16 ENCOUNTER — Encounter (HOSPITAL_COMMUNITY): Payer: Self-pay

## 2015-10-16 DIAGNOSIS — Z6837 Body mass index (BMI) 37.0-37.9, adult: Secondary | ICD-10-CM | POA: Insufficient documentation

## 2015-10-16 DIAGNOSIS — N920 Excessive and frequent menstruation with regular cycle: Secondary | ICD-10-CM | POA: Insufficient documentation

## 2015-10-16 DIAGNOSIS — N938 Other specified abnormal uterine and vaginal bleeding: Secondary | ICD-10-CM | POA: Insufficient documentation

## 2015-10-16 DIAGNOSIS — I10 Essential (primary) hypertension: Secondary | ICD-10-CM | POA: Insufficient documentation

## 2015-10-16 DIAGNOSIS — F17210 Nicotine dependence, cigarettes, uncomplicated: Secondary | ICD-10-CM | POA: Insufficient documentation

## 2015-10-16 HISTORY — PX: DILITATION & CURRETTAGE/HYSTROSCOPY WITH HYDROTHERMAL ABLATION: SHX5570

## 2015-10-16 LAB — CBC
HCT: 31.4 % — ABNORMAL LOW (ref 36.0–46.0)
Hemoglobin: 11 g/dL — ABNORMAL LOW (ref 12.0–15.0)
MCH: 28.9 pg (ref 26.0–34.0)
MCHC: 35 g/dL (ref 30.0–36.0)
MCV: 82.4 fL (ref 78.0–100.0)
Platelets: 204 10*3/uL (ref 150–400)
RBC: 3.81 MIL/uL — ABNORMAL LOW (ref 3.87–5.11)
RDW: 13.4 % (ref 11.5–15.5)
WBC: 3.6 10*3/uL — ABNORMAL LOW (ref 4.0–10.5)

## 2015-10-16 LAB — PREGNANCY, URINE: Preg Test, Ur: NEGATIVE

## 2015-10-16 SURGERY — DILATATION & CURETTAGE/HYSTEROSCOPY WITH HYDROTHERMAL ABLATION
Anesthesia: General | Site: Vagina

## 2015-10-16 MED ORDER — LIDOCAINE HCL 1 % IJ SOLN
INTRAMUSCULAR | Status: DC | PRN
  Administered 2015-10-16: 10 mL

## 2015-10-16 MED ORDER — LIDOCAINE HCL 1 % IJ SOLN
INTRAMUSCULAR | Status: AC
  Filled 2015-10-16: qty 20

## 2015-10-16 MED ORDER — KETOROLAC TROMETHAMINE 30 MG/ML IJ SOLN
INTRAMUSCULAR | Status: DC | PRN
  Administered 2015-10-16: 30 mg via INTRAVENOUS

## 2015-10-16 MED ORDER — ONDANSETRON HCL 4 MG/2ML IJ SOLN
INTRAMUSCULAR | Status: AC
  Filled 2015-10-16: qty 2

## 2015-10-16 MED ORDER — LIDOCAINE HCL (CARDIAC) 20 MG/ML IV SOLN
INTRAVENOUS | Status: DC | PRN
  Administered 2015-10-16: 40 mg via INTRAVENOUS

## 2015-10-16 MED ORDER — MEPERIDINE HCL 25 MG/ML IJ SOLN: 6.2500 mg | INTRAMUSCULAR | Status: DC | PRN

## 2015-10-16 MED ORDER — SCOPOLAMINE 1 MG/3DAYS TD PT72
MEDICATED_PATCH | TRANSDERMAL | Status: AC
  Filled 2015-10-16: qty 1

## 2015-10-16 MED ORDER — HYDROCODONE-ACETAMINOPHEN 7.5-325 MG PO TABS: 1.0000 | ORAL_TABLET | Freq: Once | ORAL | Status: DC | PRN

## 2015-10-16 MED ORDER — ONDANSETRON HCL 4 MG/2ML IJ SOLN
INTRAMUSCULAR | Status: DC | PRN
  Administered 2015-10-16: 4 mg via INTRAVENOUS

## 2015-10-16 MED ORDER — FENTANYL CITRATE (PF) 100 MCG/2ML IJ SOLN: 25.0000 ug | INTRAMUSCULAR | Status: DC | PRN

## 2015-10-16 MED ORDER — FENTANYL CITRATE (PF) 100 MCG/2ML IJ SOLN
INTRAMUSCULAR | Status: DC | PRN
  Administered 2015-10-16 (×2): 50 ug via INTRAVENOUS

## 2015-10-16 MED ORDER — PROPOFOL 10 MG/ML IV BOLUS
INTRAVENOUS | Status: DC | PRN
  Administered 2015-10-16: 200 mg via INTRAVENOUS

## 2015-10-16 MED ORDER — KETOROLAC TROMETHAMINE 30 MG/ML IJ SOLN
INTRAMUSCULAR | Status: AC
  Filled 2015-10-16: qty 1

## 2015-10-16 MED ORDER — METOCLOPRAMIDE HCL 5 MG/ML IJ SOLN: 10.0000 mg | Freq: Once | INTRAMUSCULAR | Status: DC | PRN

## 2015-10-16 MED ORDER — FENTANYL CITRATE (PF) 100 MCG/2ML IJ SOLN
INTRAMUSCULAR | Status: AC
  Filled 2015-10-16: qty 4

## 2015-10-16 MED ORDER — DEXAMETHASONE SODIUM PHOSPHATE 4 MG/ML IJ SOLN
INTRAMUSCULAR | Status: AC
  Filled 2015-10-16: qty 1

## 2015-10-16 MED ORDER — LACTATED RINGERS IV SOLN
INTRAVENOUS | Status: DC
  Administered 2015-10-16: 125 mL/h via INTRAVENOUS

## 2015-10-16 MED ORDER — OXYCODONE-ACETAMINOPHEN 5-325 MG PO TABS
ORAL_TABLET | ORAL | Status: AC
  Administered 2015-10-16: 1 via ORAL
  Filled 2015-10-16: qty 1

## 2015-10-16 MED ORDER — OXYCODONE-ACETAMINOPHEN 5-325 MG PO TABS
1.0000 | ORAL_TABLET | Freq: Once | ORAL | Status: AC
  Administered 2015-10-16: 1 via ORAL

## 2015-10-16 MED ORDER — PROPOFOL 10 MG/ML IV BOLUS
INTRAVENOUS | Status: AC
  Filled 2015-10-16: qty 20

## 2015-10-16 MED ORDER — SCOPOLAMINE 1 MG/3DAYS TD PT72
1.0000 | MEDICATED_PATCH | Freq: Once | TRANSDERMAL | Status: DC
  Administered 2015-10-16: 1.5 mg via TRANSDERMAL

## 2015-10-16 MED ORDER — MIDAZOLAM HCL 2 MG/2ML IJ SOLN
INTRAMUSCULAR | Status: DC | PRN
  Administered 2015-10-16: 2 mg via INTRAVENOUS

## 2015-10-16 MED ORDER — DEXAMETHASONE SODIUM PHOSPHATE 10 MG/ML IJ SOLN
INTRAMUSCULAR | Status: DC | PRN
  Administered 2015-10-16: 4 mg via INTRAVENOUS

## 2015-10-16 MED ORDER — MIDAZOLAM HCL 2 MG/2ML IJ SOLN
INTRAMUSCULAR | Status: AC
  Filled 2015-10-16: qty 4

## 2015-10-16 SURGICAL SUPPLY — 10 items
CATH ROBINSON RED A/P 16FR (CATHETERS) ×3 IMPLANT
CLOTH BEACON ORANGE TIMEOUT ST (SAFETY) ×3 IMPLANT
CONTAINER PREFILL 10% NBF 60ML (FORM) ×6 IMPLANT
GLOVE BIO SURGEON STRL SZ8.5 (GLOVE) ×3 IMPLANT
GOWN STRL REUS W/TWL 2XL LVL3 (GOWN DISPOSABLE) ×3 IMPLANT
GOWN STRL REUS W/TWL LRG LVL3 (GOWN DISPOSABLE) ×3 IMPLANT
PACK VAGINAL MINOR WOMEN LF (CUSTOM PROCEDURE TRAY) ×3 IMPLANT
PAD OB MATERNITY 4.3X12.25 (PERSONAL CARE ITEMS) ×3 IMPLANT
SET GENESYS HTA PROCERVA (MISCELLANEOUS) ×3 IMPLANT
TOWEL OR 17X24 6PK STRL BLUE (TOWEL DISPOSABLE) ×6 IMPLANT

## 2015-10-16 NOTE — Anesthesia Procedure Notes (Signed)
Procedure Name: LMA Insertion Date/Time: 10/16/2015 7:21 AM Performed by: Bufford Spikes Pre-anesthesia Checklist: Patient identified, Timeout performed, Emergency Drugs available, Suction available and Patient being monitored Patient Re-evaluated:Patient Re-evaluated prior to inductionOxygen Delivery Method: Circle system utilized Preoxygenation: Pre-oxygenation with 100% oxygen Intubation Type: IV induction Ventilation: Mask ventilation without difficulty LMA: LMA inserted LMA Size: 4.0 Grade View: Grade I Tube type: Oral Number of attempts: 1 Placement Confirmation: positive ETCO2 and breath sounds checked- equal and bilateral Tube secured with: Tape Dental Injury: Teeth and Oropharynx as per pre-operative assessment

## 2015-10-16 NOTE — Op Note (Signed)
Preop diagnosis dysfunctional uterine bleeding Postop diagnosis hysteroscopy D&C and HTA Anesthesia general Surgeon Dr. Gracy Racer Procedure patient placed in the lithotomy position after general anesthesia administered abdomen prepped and draped bladder  Emptied  straight catheter bimanual exam revealed uterus to be normal size negative adnexa speculum placed in the vagina cervix injected with 10 cc 1% Xylocaine endometrial cavity sounded 10 cm cervix dilated to #25 Pratt hysteroscope inserted and the cavity was normal hysteroscope removed and a sharp curettage done a moderate amount of tissue obtained and then the HTA inserted cavity integrity test  Passed  and the cavity and the fluid was heated to 80C and then HTA performed 10 minutes and there was a two-minute cool down. And the device removed and the patient tolerated the procedure well taken to recovery room in good condition

## 2015-10-16 NOTE — Anesthesia Postprocedure Evaluation (Signed)
  Anesthesia Post-op Note  Patient: Darlene Black  Procedure(s) Performed: Procedure(s): DILATATION & CURETTAGE/HYSTEROSCOPY WITH HYDROTHERMAL ABLATION (N/A)  Patient Location: PACU  Anesthesia Type:General  Level of Consciousness: awake, alert  and oriented  Airway and Oxygen Therapy: Patient Spontanous Breathing  Post-op Pain: mild  Post-op Assessment: Post-op Vital signs reviewed, Patient's Cardiovascular Status Stable, Respiratory Function Stable, Patent Airway, No signs of Nausea or vomiting and Pain level controlled              Post-op Vital Signs: Reviewed and stable  Last Vitals:  Filed Vitals:   10/16/15 0900  BP:   Pulse:   Temp: 36.9 C  Resp:     Complications: No apparent anesthesia complications

## 2015-10-16 NOTE — H&P (Signed)
There has been no change in her condition since the initial dictation of the history and physical

## 2015-10-16 NOTE — Discharge Instructions (Signed)
D&C Hysterosocpy Care After Read the instructions below. Refer to this sheet in the next few weeks. These instructions provide you with general information on caring for yourself after you leave the hospital. Your caregiver may also give you specific instructions.  D&C or vacuum curettage is a minor operation. A D&C involves the stretching (dilatation) of the cervix and scraping (curettage) of the inside lining of the uterus. A vacuum curettage gently sucks out the lining and tissue in the uterus with a tube. You may have light cramping and bleeding for a couple of days to two weeks after the procedure. This procedure may be done in a hospital, outpatient clinic, or doctor's office. You may be given a drug to make you sleep (general anesthetic) or a drug that numbs the area (local anesthetic) in and around the cervix. HOME CARE INSTRUCTIONS  Do not drive for 24 hours.   Wait one week before returning to strenuous activities.   Take your temperature two times a day for 4 days and write it down. Provide these temperatures to your caregiver if they are abnormal (above 98.6 F or 37.0 C).   Avoid long periods of standing, and do no heavy lifting (more than 10 pounds), pushing or pulling.   Limit stair climbing to once or twice a day.   Take rest periods often.   You may resume your usual diet.   Drink plenty of fluids (6-8 glasses a day).   You should return to your usual bowel function. If constipation should occur, you may:   Take a mild laxative with permission from your caregiver.   Add fruit and bran to your diet.   Drink more fluids. This helps with constipation.   Take showers instead of baths until your caregiver gives you permission to take baths.   Do not go swimming or use a hot tub until your caregiver gives you permission.   Try to have someone with you or available for you the first 24 to 48 hours, especially if you had a general anesthetic.   Do not douche, use  tampons, or have intercourse until after your follow-up appointment, or when your caregiver approves.   Only take over-the-counter or prescription medicines for pain, discomfort, or fever as directed by your caregiver. Do not take aspirin. It can cause bleeding.   If a prescription was given, follow your caregiver's directions. You may be given a medicine that kills germs (antibiotic) to prevent an infection.   Keep all your follow-up appointments recommended by your caregiver.  SEEK MEDICAL CARE IF:  You have increasing cramps or pain not relieved with medication.   You develop belly (abdominal) pain which does not seem to be related to the same area of earlier cramping and pain.   You feel dizzy or feel like fainting.   You have bad smelling vaginal discharge.   You develop a rash.   You develop a reaction or allergy to your medication.  SEEK IMMEDIATE MEDICAL CARE IF:  Bleeding is heavier than a normal menstrual period.   You have an oral temperature above 100.6, not controlled by medicine.   You develop chest pain.   You develop shortness of breath.   You pass out.   You develop pain in your shoulder strap area.   You develop heavy vaginal bleeding with or without blood clots.  MAKE SURE YOU:   Understand these instructions.   Will watch your condition.   Will get help right away if you are  not doing well or get worse.  UPDATED HEALTH PRACTICES  A PAP smear is done to screen for cervical cancer.   The first PAP smear should be done at age 95.   Between ages 26 and 3, PAP smears are repeated every 2 years.   Beginning at age 8, you are advised to have a PAP smear every 3 years as long as your past 3 PAP smears have been normal.   Some women have medical problems that increase the chance of getting cervical cancer. Talk to your caregiver about these problems. It is especially important to talk to your caregiver if a new problem develops soon after your last PAP  smear. In these cases, your caregiver may recommend more frequent screening and Pap smears.   The above recommendations are the same for women who have or have not gotten the vaccine for HPV (Human Papillomavirus).   If you had a hysterectomy for a problem that was not a cancer or a condition that could lead to cancer, then you no longer need Pap smears.   If you are between ages 67 and 35, and you have had normal Pap smears going back 10 years, you no longer need Pap smears.   If you have had past treatment for cervical cancer or a condition that could lead to cancer, you need Pap smears and screening for cancer for at least 20 years after your treatment.   Continue monthly self-breast examinations. Your caregiver can provide information and instructions for self-breast examination.  Document Released: 11/27/2000 Document Re-Released: 05/20/2010 Exit[employer] Patient Information 2011 [employer]. ]  May have ibuprofen/motrin/advil at 1:30PM Drink more water next 48 hours Can use heating pad to abd and upper shoulders

## 2015-10-16 NOTE — Transfer of Care (Signed)
Immediate Anesthesia Transfer of Care Note  Patient: Darlene Black  Procedure(s) Performed: Procedure(s): DILATATION & CURETTAGE/HYSTEROSCOPY WITH HYDROTHERMAL ABLATION (N/A)  Patient Location: PACU  Anesthesia Type:General  Level of Consciousness: awake, alert  and oriented  Airway & Oxygen Therapy: Patient Spontanous Breathing and Patient connected to nasal cannula oxygen  Post-op Assessment: Report given to RN and Post -op Vital signs reviewed and stable  Post vital signs: Reviewed and stable  Last Vitals:  Filed Vitals:   10/16/15 0640  BP: 156/95  Pulse: 69  Temp: 36.6 C  Resp: 16    Complications: No apparent anesthesia complications

## 2015-10-17 ENCOUNTER — Encounter (HOSPITAL_COMMUNITY): Payer: Self-pay | Admitting: Obstetrics

## 2016-01-27 DIAGNOSIS — M5416 Radiculopathy, lumbar region: Secondary | ICD-10-CM | POA: Insufficient documentation

## 2016-01-27 DIAGNOSIS — F4320 Adjustment disorder, unspecified: Secondary | ICD-10-CM | POA: Insufficient documentation

## 2016-07-23 ENCOUNTER — Encounter (HOSPITAL_COMMUNITY): Payer: Self-pay

## 2016-07-23 ENCOUNTER — Emergency Department (HOSPITAL_COMMUNITY)
Admission: EM | Admit: 2016-07-23 | Discharge: 2016-07-23 | Disposition: A | Discharge status: Home / Self Care | Payer: Managed Care, Other (non HMO) | Attending: Emergency Medicine | Admitting: Emergency Medicine

## 2016-07-23 DIAGNOSIS — M109 Gout, unspecified: Secondary | ICD-10-CM

## 2016-07-23 DIAGNOSIS — M10072 Idiopathic gout, left ankle and foot: Secondary | ICD-10-CM | POA: Insufficient documentation

## 2016-07-23 DIAGNOSIS — I10 Essential (primary) hypertension: Secondary | ICD-10-CM | POA: Insufficient documentation

## 2016-07-23 DIAGNOSIS — F17210 Nicotine dependence, cigarettes, uncomplicated: Secondary | ICD-10-CM | POA: Insufficient documentation

## 2016-07-23 DIAGNOSIS — M79675 Pain in left toe(s): Secondary | ICD-10-CM

## 2016-07-23 MED ORDER — PREDNISONE 20 MG PO TABS: ORAL_TABLET | ORAL | 0 refills | Status: DC

## 2016-07-23 MED ORDER — HYDROCODONE-ACETAMINOPHEN 5-325 MG PO TABS: 1.0000 | ORAL_TABLET | Freq: Four times a day (QID) | ORAL | 0 refills | Status: DC | PRN

## 2016-07-23 MED ORDER — NAPROXEN 500 MG PO TABS: 500.0000 mg | ORAL_TABLET | Freq: Two times a day (BID) | ORAL | 0 refills | Status: DC

## 2016-07-23 NOTE — ED Triage Notes (Signed)
Patient here with left foot pain and swelling x 2 days, swelling noted around top of foot and great toe, also has chronic back pain related to DDD.  denies trauma

## 2016-07-23 NOTE — ED Notes (Signed)
Declined W/C at D/C and was escorted to lobby by RN. 

## 2016-07-23 NOTE — ED Provider Notes (Signed)
[facility] DEPT Provider Note   CSN: [id number] Arrival date & time: 07/23/16  [id number]  First Provider Contact:  First MD Initiated Contact with Patient 07/23/16 [phone]        History   Chief Complaint No chief complaint on file.   HPI Darlene Black is a 40 y.o. female with a PMHx of chronic back pain s/p C6-7 fusion, L great toe onychomycosis, fibroids, HTN, and nephrolithiasis, who presents to the ED with complaints of left great toe pain and swelling 1 day. She describes the pain is 7/10 constant sharp nonradiating pain at the MTP joint, worse with walking, with no treatments tried prior to arrival. Associated symptoms include erythema and swelling. She denies any known personal or family history of gout. Denies any injuries to the foot, or any skin injuries/wounds. She has toenail fungus that she states is chronic and she is treating with OTC meds. She ate seafood salad approximately 3 days prior to onset. Denies any alcohol use. She also denies any warmth, fevers, chills, chest pain, shortness breath, abdominal pain, nausea, vomiting, diarrhea, constipation, dysuria, hematuria, numbness, tingling, or focal weakness.   The history is provided by the patient and medical records. No language interpreter was used.    Past Medical History:  Diagnosis Date  . Fibroid   . Hypertension    had been on lisinopril prior to pregnacy 2013-was d/c and has not been restarted on anything  . Kidney stone     There are no active problems to display for this patient.   Past Surgical History:  Procedure Laterality Date  . CESAREAN SECTION  10/10/2012   Procedure: CESAREAN SECTION;  Surgeon: Frederico Hamman, MD;  Location: [facility];  Service: Obstetrics;  Laterality: N/A;  . DILITATION & CURRETTAGE/HYSTROSCOPY WITH HYDROTHERMAL ABLATION N/A 10/16/2015   Procedure: DILATATION & CURETTAGE/HYSTEROSCOPY WITH HYDROTHERMAL ABLATION;  Surgeon: Frederico Hamman, MD;  Location: [facility];  Service:  Gynecology;  Laterality: N/A;  . LAPAROSCOPIC TUBAL LIGATION  01/18/2013   Procedure: LAPAROSCOPIC TUBAL LIGATION;  Surgeon: Frederico Hamman, MD;  Location: [facility];  Service: Gynecology;  Laterality: Bilateral;  . SPINE SURGERY  February 2011   C6 metal bracing   . TUBAL LIGATION      OB History    Gravida Para Term Preterm AB Living   2 2 2  0 0 2   SAB TAB Ectopic Multiple Live Births   0 0 0 0 2       Home Medications    Prior to Admission medications   Not on File    Family History Family History  Problem Relation Age of Onset  . Hypertension Mother   . Heart disease Mother   . Arthritis Mother   . Hyperlipidemia Mother   . Hypertension Maternal Grandmother   . Heart disease Maternal Grandmother   . Arthritis Maternal Grandmother   . Hypertension Maternal Grandfather   . Heart disease Maternal Grandfather   . Anesthesia problems Neg Hx   . Other Neg Hx     Social History Social History  Substance Use Topics  . Smoking status: Current Every Day Smoker    Packs/day: 1.00    Years: 20.00    Types: Cigarettes    Last attempt to quit: 01/28/2012  . Smokeless tobacco: Never Used  . Alcohol use No     Allergies   Review of patient's allergies indicates no known allergies.   Review of Systems Review of Systems  Constitutional: Negative for chills  and fever.  Respiratory: Negative for shortness of breath.   Cardiovascular: Negative for chest pain.  Gastrointestinal: Negative for abdominal pain, constipation, diarrhea, nausea and vomiting.  Genitourinary: Negative for dysuria and hematuria.  Musculoskeletal: Positive for arthralgias and joint swelling. Negative for myalgias.  Skin: Positive for color change. Negative for wound.  Allergic/Immunologic: Negative for immunocompromised state.  Neurological: Negative for weakness and numbness.  Psychiatric/Behavioral: Negative for confusion.   10 Systems reviewed and are negative for acute change except as  noted in the HPI.   Physical Exam Updated Vital Signs BP 164/100 (BP Location: Left Arm)   Pulse 89   Temp 99.7 F (37.6 C) (Oral)   Resp 18   Ht 5' 2.5" (1.588 m)   Wt 93 kg   SpO2 100%   BMI 36.90 kg/m   Physical Exam  Constitutional: She is oriented to person, place, and time. Vital signs are normal. She appears well-developed and well-nourished.  Non-toxic appearance. No distress.  Afebrile, nontoxic, NAD; HTN noted same as all prior visits  HENT:  Head: Normocephalic and atraumatic.  Mouth/Throat: Mucous membranes are normal.  Eyes: Conjunctivae and EOM are normal. Right eye exhibits no discharge. Left eye exhibits no discharge.  Neck: Normal range of motion. Neck supple.  Cardiovascular: Normal rate and intact distal pulses.   Pulmonary/Chest: Effort normal. No respiratory distress.  Abdominal: Normal appearance. She exhibits no distension.  Musculoskeletal: Normal range of motion.       Left foot: There is tenderness and swelling. There is normal range of motion, normal capillary refill, no crepitus, no deformity and no laceration.       Feet:  L great toe at MTP joint with FROM intact although slightly painful, with moderate TTP to the MTP joint, minimal swelling noted, minimal warmth and erythema, no cellulitic skin changes, skin intact, no crepitus or deformity, strength and sensation grossly intact, distal pulses intact. Onychomycosis of toenails noted, which is chronic per patient.  Neurological: She is alert and oriented to person, place, and time. She has normal strength. No sensory deficit.  Skin: Skin is warm, dry and intact. No rash noted.  Psychiatric: She has a normal mood and affect. Her behavior is normal.  Nursing note and vitals reviewed.    ED Treatments / Results  Labs (all labs ordered are listed, but only abnormal results are displayed) Labs Reviewed - No data to display  EKG  EKG Interpretation None       Radiology No results  found.  Procedures Procedures (including critical care time)  Medications Ordered in ED Medications - No data to display   Initial Impression / Assessment and Plan / ED Course  I have reviewed the triage vital signs and the nursing notes.  Pertinent labs & imaging results that were available during my care of the patient were reviewed by me and considered in my medical decision making (see chart for details).  Clinical Course    40 y.o. female here with L great toe pain/swelling/redness, no hx of gout that she knows of but started after eating seafood salad. On exam, FROM intact in digit although somewhat painful, mild swelling to the L MTP joint, warmth and redness noted, no cellulitic skin changes, no pedal edema. Onychomycosis to toenails noted, which pt states is chronic. NVI with soft compartments. Seems most consistent with acute gout. Discussed that serum uric acid won't result today, but she can see her PCP for this testing to formally diagnose gout. For now, will  start prednisone and naprosyn, and give norco for additional pain relief. RICE discussed. Doubt septic joint or cellulitis. Doubt need for imaging. F/up with PCP in 1-2 days. I explained the diagnosis and have given explicit precautions to return to the ER including for any other new or worsening symptoms. The patient understands and accepts the medical plan as it's been dictated and I have answered their questions. Discharge instructions concerning home care and prescriptions have been given. The patient is STABLE and is discharged to home in good condition.   Final Clinical Impressions(s) / ED Diagnoses   Final diagnoses:  Great toe pain, left  Acute gout of left foot, unspecified cause    New Prescriptions New Prescriptions   HYDROCODONE-ACETAMINOPHEN (NORCO) 5-325 MG TABLET    Take 1 tablet by mouth every 6 (six) hours as needed for severe pain.   NAPROXEN (NAPROSYN) 500 MG TABLET    Take 1 tablet (500 mg total) by  mouth 2 (two) times daily with a meal. x5-7 days   PREDNISONE (DELTASONE) 20 MG TABLET    3 tabs po daily x 4 days     [employer], PA-C 07/23/16 [id number]    Margette Fast, MD 07/23/16 [id number]

## 2016-07-23 NOTE — Discharge Instructions (Signed)
Your toe pain seems most consistent with gout. Take naprosyn and prednisone as directed to help with this issue. Use norco as directed as needed for additional pain relief, but don't drive while taking norco. Ice and elevate your foot to help with pain and swelling. Eat a low-purine diet to help avoid gout problems. Follow up with your regular doctor in the next 1-2 days for ongoing evaluation and formal diagnosis of gout. Return to the ER for changes or worsening symptoms.

## 2016-10-20 ENCOUNTER — Encounter (INDEPENDENT_AMBULATORY_CARE_PROVIDER_SITE_OTHER): Payer: Self-pay | Admitting: Orthopaedic Surgery

## 2016-10-20 ENCOUNTER — Ambulatory Visit (INDEPENDENT_AMBULATORY_CARE_PROVIDER_SITE_OTHER): Payer: Managed Care, Other (non HMO) | Admitting: Orthopaedic Surgery

## 2016-10-20 ENCOUNTER — Ambulatory Visit (INDEPENDENT_AMBULATORY_CARE_PROVIDER_SITE_OTHER): Payer: Managed Care, Other (non HMO)

## 2016-10-20 VITALS — BP 157/101 | HR 92

## 2016-10-20 DIAGNOSIS — M545 Low back pain, unspecified: Secondary | ICD-10-CM

## 2016-10-20 DIAGNOSIS — G8929 Other chronic pain: Secondary | ICD-10-CM | POA: Diagnosis not present

## 2016-10-20 NOTE — Progress Notes (Addendum)
Office Visit Note   Patient: Darlene Black           Date of Birth: 1976-02-12           MRN: [id number] Visit Date: 10/20/2016 Requested by: Vicenta Aly, [place],  [id number] PCP: Vicenta Aly, FNP  Subjective: Chief Complaint  Patient presents with  . Lower Back - Pain   Patient is referred here by her disability insurance company. She is out of work receiving disability payments and has been recommended to have surgery which her insurance company has not approved. She is attempted work resumption and it aggrivated her pain. She's had previous cervical fusion 2 with good results. Patient is here complaining of LBP.  She has been out of work since March 2017 when pain started. She saw Dr Ron Agee, had two ESI's, did some PT.  Nothing has helped.  She had an MRI, then she saw her [facility] who did her neck surgery in [place].  She asked him if he would do the lumbar surgery for her.  He was going to do this, but [facility] did not approve the surgery that he wanted to do. Per patient, she received communication from [place] that she was approved for surgery, just not the one her [facility] wanted.  Her [facility] had told her they denied all together.  She has since seen Dr Rolena Infante for second opinion and he agrees that she needs surgery, but is not a candidate for disc replacement.  The disc at L5-S1 is pressing a nerve and is giving her problems with leg functions.  Numbness from left hip all the way down to toes.  Right hip hurts as well.  Bilaterally she has groin and troch pain, L>R.  She cannot sit long, stand or walk long. She limps is she walks too much. She is taking tramadol for pain.  She is in severe pain, is suffering and wants some relief.   Records were faxed to [place] and they are in [facility].  MRI scan done at [facility] and should be on [place].                 Review of Systems  Constitutional: Negative for chills and diaphoresis.  HENT: Negative for ear discharge, ear pain  and nosebleeds.   Eyes: Negative for discharge and visual disturbance.  Respiratory: Negative for cough, choking and shortness of breath.   Cardiovascular: Negative for chest pain and palpitations.  Gastrointestinal: Negative for abdominal distention and abdominal pain.  Endocrine: Negative for cold intolerance and heat intolerance.  Genitourinary: Negative for flank pain and hematuria.        positive history of GYN surgeries as listed.  Skin: Negative for rash and wound.  Neurological: Negative for seizures and speech difficulty.  Hematological: Negative for adenopathy. Does not bruise/bleed easily.  Psychiatric/Behavioral: Negative for agitation and suicidal ideas.       She has converted to [place] and no longer uses any of those substances   Patient has had previous lumbar series which shows facet arthropathy at L5-S1. MRI scan on February 2017 showed broad-based disc protrusion with moderate by foraminal stenosis with some congenital lumbar stenosis. No compression at the L4-5 level or levels above. Adequate disc hydration at other lumbar levels above L5-S1.  Assessment & Plan: Visit Diagnoses:  1. 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 I do not think she is a good candidate for simple laminectomy since she has 2 mm retrolisthesis of L5-S1 and also has by foraminal stenosis which is at least moderate in degree. Surgical treatment would require a fusion. She has some facet degenerative changes and  she is not a good candidate for disc arthroplasty due to some degree of congenital stenosis and abnormal posterior facets. Surgical options would include the fusion with restoration of disc space height and correction of her by foraminal stenosis. I discussed with patient and  husband that patients with retrolisthesis and by foraminal stenosis or not good candidate for simple laminectomy and do poorly with increased pain and ultimately require a second procedure soon afterwards due to the instability problem. We discussed the problems of retrolisthesis present that are not occurred with the small degrees of anterolisthesis which is usually tolerated quite well. Patient states she's been turned down for the surgery multiple times from the insurance company. She states she cannot tolerate the pain and needs to have something done.  Follow-Up Instructions: Return if symptoms worsen or fail to improve. She can consider options decide what certain she like to have her treat her condition and call back if she would like to proceed.  Orders:  Orders Placed This Encounter  Procedures  . XR Lumbar Spine 2-3 Views   No orders of the defined types were placed in this encounter.     Procedures: No procedures performed   Clinical Data: No additional findings.  Objective: Vital Signs: BP (!) 157/101   Pulse 92   Physical Exam  Constitutional: She is oriented to person, place, and time. She appears well-developed.  HENT:  Head: Normocephalic.  Right Ear: External ear normal.  Left Ear: External ear normal.  Eyes: Pupils are equal, round, and reactive to light.  Neck: No tracheal deviation present. No thyromegaly present.  Cardiovascular: Normal rate.   Pulmonary/Chest: Effort normal.  Abdominal: Soft. She exhibits no distension. There is no tenderness. There is no guarding.  Musculoskeletal: She exhibits no edema or deformity.  Neurological: She is alert and oriented to person, place, and time.  Skin: Skin is warm and dry.  Psychiatric: She has a normal mood and affect. Her behavior is normal.    Ortho Exam patients able to get from sitting to standing she can ambulate with heel toe gait. She has some pain with straight leg raising both right and left pain with  popliteal compression test positive straight leg raising 80. Anterior tib EHL gastrocsoleus is strong. Bilateral sciatic notch tenderness tenderness at the lumbosacral junction. For flexion fingertips to ankle. She has pain when she resumes erect position increased pain with extension. Lateral tilting and rotation is 50% lumbar normal excursion. Knee and ankle jerk are 1+ and symmetrical. No calf tenderness negative [place] no lymphedema. Mild tenderness over the left posterior tibial tendon noted at the ankle she has good posterior tibial tendon strength with resisted testing  Specialty Comments:  No specialty comments available.  Imaging: Xr Lumbar Spine 2-3 Views  Result Date: 10/20/2016 4 views lumbar spine are reviewed AP lateral and lateral flexion-extension lumbar x-ray. This shows collapse of the L5-S1 disc space normal lordosis. Other levels are well maintained. Anterior posterior spurring and 60% loss of disc space height at L5-S1. 2 mm of retrolisthesis at L5-S1. Impression: L5-S1 disc degeneration with 2 mm retrolisthesis.    PMFS History: There  are no active problems to display for this patient.  Past Medical History:  Diagnosis Date  . Fibroid   . Hypertension    had been on lisinopril prior to pregnacy 2013-was d/c and has not been restarted on anything  . Kidney stone     Family History  Problem Relation Age of Onset  . Hypertension Mother   . Heart disease Mother   . Arthritis Mother   . Hyperlipidemia Mother   . Hypertension Maternal Grandmother   . Heart disease Maternal Grandmother   . Arthritis Maternal Grandmother   . Hypertension Maternal Grandfather   . Heart disease Maternal Grandfather   . Anesthesia problems Neg Hx   . Other Neg Hx     Past Surgical History:  Procedure Laterality Date  . CESAREAN SECTION  10/10/2012   Procedure: CESAREAN SECTION;  Surgeon: Frederico Hamman, MD;  Location: [facility];  Service: Obstetrics;  Laterality: N/A;  . DILITATION  & CURRETTAGE/HYSTROSCOPY WITH HYDROTHERMAL ABLATION N/A 10/16/2015   Procedure: DILATATION & CURETTAGE/HYSTEROSCOPY WITH HYDROTHERMAL ABLATION;  Surgeon: Frederico Hamman, MD;  Location: [facility];  Service: Gynecology;  Laterality: N/A;  . LAPAROSCOPIC TUBAL LIGATION  01/18/2013   Procedure: LAPAROSCOPIC TUBAL LIGATION;  Surgeon: Frederico Hamman, MD;  Location: [facility];  Service: Gynecology;  Laterality: Bilateral;  . SPINE SURGERY  February 2011   C6 metal bracing   . TUBAL LIGATION     Social History   Occupational History  . Not on file.   Social History Main Topics  . Smoking status: Current Every Day Smoker    Packs/day: 1.00    Years: 20.00    Types: Cigarettes    Last attempt to quit: 01/28/2012  . Smokeless tobacco: Never Used  . Alcohol use No  . Drug use: No  . Sexual activity: Yes    Birth control/ protection: Surgical     Comment: tubal at 6 weeks

## 2016-11-29 ENCOUNTER — Inpatient Hospital Stay (HOSPITAL_COMMUNITY): Payer: Self-pay

## 2016-11-29 ENCOUNTER — Inpatient Hospital Stay (HOSPITAL_COMMUNITY)
Admission: EM | Admit: 2016-11-29 | Discharge: 2016-11-30 | DRG: 309 | Disposition: A | Discharge status: Home / Self Care | Payer: Self-pay | Attending: Cardiology | Admitting: Cardiology

## 2016-11-29 ENCOUNTER — Encounter (HOSPITAL_COMMUNITY): Payer: Self-pay | Admitting: Emergency Medicine

## 2016-11-29 ENCOUNTER — Emergency Department (HOSPITAL_COMMUNITY): Payer: Self-pay

## 2016-11-29 DIAGNOSIS — E876 Hypokalemia: Secondary | ICD-10-CM | POA: Diagnosis present

## 2016-11-29 DIAGNOSIS — G8929 Other chronic pain: Secondary | ICD-10-CM | POA: Diagnosis present

## 2016-11-29 DIAGNOSIS — Z8249 Family history of ischemic heart disease and other diseases of the circulatory system: Secondary | ICD-10-CM

## 2016-11-29 DIAGNOSIS — M545 Low back pain: Secondary | ICD-10-CM | POA: Diagnosis present

## 2016-11-29 DIAGNOSIS — Z8261 Family history of arthritis: Secondary | ICD-10-CM

## 2016-11-29 DIAGNOSIS — G4730 Sleep apnea, unspecified: Secondary | ICD-10-CM | POA: Diagnosis present

## 2016-11-29 DIAGNOSIS — I10 Essential (primary) hypertension: Secondary | ICD-10-CM

## 2016-11-29 DIAGNOSIS — Z823 Family history of stroke: Secondary | ICD-10-CM

## 2016-11-29 DIAGNOSIS — E669 Obesity, unspecified: Secondary | ICD-10-CM

## 2016-11-29 DIAGNOSIS — I4891 Unspecified atrial fibrillation: Secondary | ICD-10-CM | POA: Diagnosis present

## 2016-11-29 DIAGNOSIS — Z7901 Long term (current) use of anticoagulants: Secondary | ICD-10-CM

## 2016-11-29 DIAGNOSIS — M519 Unspecified thoracic, thoracolumbar and lumbosacral intervertebral disc disorder: Secondary | ICD-10-CM

## 2016-11-29 DIAGNOSIS — M109 Gout, unspecified: Secondary | ICD-10-CM | POA: Diagnosis present

## 2016-11-29 DIAGNOSIS — I959 Hypotension, unspecified: Secondary | ICD-10-CM | POA: Diagnosis present

## 2016-11-29 DIAGNOSIS — Z91048 Other nonmedicinal substance allergy status: Secondary | ICD-10-CM

## 2016-11-29 DIAGNOSIS — F17210 Nicotine dependence, cigarettes, uncomplicated: Secondary | ICD-10-CM | POA: Diagnosis present

## 2016-11-29 DIAGNOSIS — Z6841 Body Mass Index (BMI) 40.0 and over, adult: Secondary | ICD-10-CM

## 2016-11-29 DIAGNOSIS — I480 Paroxysmal atrial fibrillation: Principal | ICD-10-CM | POA: Diagnosis present

## 2016-11-29 HISTORY — DX: Unspecified thoracic, thoracolumbar and lumbosacral intervertebral disc disorder: M51.9

## 2016-11-29 HISTORY — DX: Paroxysmal atrial fibrillation: I48.0

## 2016-11-29 HISTORY — DX: Obesity, unspecified: E66.9

## 2016-11-29 LAB — CBC WITH DIFFERENTIAL/PLATELET
BASOS ABS: 0 10*3/uL (ref 0.0–0.1)
BASOS PCT: 1 %
Eosinophils Absolute: 0.1 10*3/uL (ref 0.0–0.7)
Eosinophils Relative: 4 %
HEMATOCRIT: 34.1 % — AB (ref 36.0–46.0)
HEMOGLOBIN: 12.2 g/dL (ref 12.0–15.0)
LYMPHS PCT: 37 %
Lymphs Abs: 1.2 10*3/uL (ref 0.7–4.0)
MCH: 29.3 pg (ref 26.0–34.0)
MCHC: 35.8 g/dL (ref 30.0–36.0)
MCV: 82 fL (ref 78.0–100.0)
MONOS PCT: 11 %
Monocytes Absolute: 0.4 10*3/uL (ref 0.1–1.0)
NEUTROS ABS: 1.5 10*3/uL — AB (ref 1.7–7.7)
NEUTROS PCT: 47 %
Platelets: 238 10*3/uL (ref 150–400)
RBC: 4.16 MIL/uL (ref 3.87–5.11)
RDW: 12.6 % (ref 11.5–15.5)
WBC: 3.2 10*3/uL — ABNORMAL LOW (ref 4.0–10.5)

## 2016-11-29 LAB — TROPONIN I
Troponin I: <0.03 ng/mL (ref <0.03)
Troponin I: <0.03 ng/mL (ref <0.03)
Troponin I: <0.03 ng/mL (ref <0.03)

## 2016-11-29 LAB — BASIC METABOLIC PANEL
ANION GAP: 11 (ref 5–15)
BUN: 15 mg/dL (ref 6–20)
CALCIUM: 9.3 mg/dL (ref 8.9–10.3)
CHLORIDE: 105 mmol/L (ref 101–111)
CO2: 23 mmol/L (ref 22–32)
Creatinine, Ser: 1.01 mg/dL — ABNORMAL HIGH (ref 0.44–1.00)
GFR calc Af Amer: >60 mL/min (ref >60)
GFR calc non Af Amer: >60 mL/min (ref >60)
GLUCOSE: 91 mg/dL (ref 65–99)
Potassium: 3.8 mmol/L (ref 3.5–5.1)
Sodium: 139 mmol/L (ref 135–145)

## 2016-11-29 LAB — ECHOCARDIOGRAM COMPLETE
Height: 62 in
Weight: 3512 oz

## 2016-11-29 LAB — I-STAT BETA HCG BLOOD, ED (MC, WL, AP ONLY): I-stat hCG, quantitative: <5 m[IU]/mL (ref <5)

## 2016-11-29 LAB — BRAIN NATRIURETIC PEPTIDE: B NATRIURETIC PEPTIDE 5: 23.8 pg/mL (ref 0.0–100.0)

## 2016-11-29 LAB — TSH: TSH: 1.996 u[IU]/mL (ref 0.350–4.500)

## 2016-11-29 MED ORDER — METOPROLOL TARTRATE 50 MG PO TABS
50.0000 mg | ORAL_TABLET | Freq: Two times a day (BID) | ORAL | Status: DC
  Administered 2016-11-29 – 2016-11-30 (×3): 50 mg via ORAL
  Filled 2016-11-29: qty 1
  Filled 2016-11-29: qty 2
  Filled 2016-11-29: qty 1

## 2016-11-29 MED ORDER — DILTIAZEM LOAD VIA INFUSION
10.0000 mg | Freq: Once | INTRAVENOUS | Status: AC
Start: 2016-11-29 — End: 2016-11-29
  Administered 2016-11-29: 10 mg via INTRAVENOUS
  Filled 2016-11-29: qty 10

## 2016-11-29 MED ORDER — TRAMADOL HCL 50 MG PO TABS
50.0000 mg | ORAL_TABLET | Freq: Three times a day (TID) | ORAL | Status: DC
  Administered 2016-11-29 – 2016-11-30 (×3): 50 mg via ORAL
  Filled 2016-11-29 (×3): qty 1

## 2016-11-29 MED ORDER — ONDANSETRON HCL 4 MG/2ML IJ SOLN: 4.0000 mg | Freq: Four times a day (QID) | INTRAMUSCULAR | Status: DC | PRN

## 2016-11-29 MED ORDER — DILTIAZEM HCL 100 MG IV SOLR
5.0000 mg/h | INTRAVENOUS | Status: DC
  Administered 2016-11-29: 5 mg/h via INTRAVENOUS
  Administered 2016-11-29: 10 mg/h via INTRAVENOUS
  Filled 2016-11-29: qty 100

## 2016-11-29 MED ORDER — APIXABAN 5 MG PO TABS
5.0000 mg | ORAL_TABLET | Freq: Two times a day (BID) | ORAL | Status: DC
  Administered 2016-11-29 – 2016-11-30 (×3): 5 mg via ORAL
  Filled 2016-11-29 (×3): qty 1

## 2016-11-29 MED ORDER — ACETAMINOPHEN 325 MG PO TABS: 650.0000 mg | ORAL_TABLET | ORAL | Status: DC | PRN

## 2016-11-29 MED ORDER — DEXTROSE 5 % IV SOLN: 5.0000 mg/h | INTRAVENOUS | Status: DC

## 2016-11-29 MED ORDER — POTASSIUM CHLORIDE CRYS ER 20 MEQ PO TBCR
40.0000 meq | EXTENDED_RELEASE_TABLET | Freq: Once | ORAL | Status: AC
  Administered 2016-11-29: 40 meq via ORAL
  Filled 2016-11-29: qty 2

## 2016-11-29 NOTE — H&P (Addendum)
Patient ID: Darlene Black MRN: [id number], DOB/AGE: 18-Sep-1976   Admit date: 11/29/2016   Primary Physician: Vicenta Aly, Irene Primary Cardiologist: New Reason for admission: new onset atrial fibrillation   Pt. Profile:  Darlene Black is a 40 y.o. female with a history of untreated HTN, gout, obesity and chronic back pain on disability who presented to [facility] ED today with chest pain and palpitations and found to be in afib with RVR.   She was in her usual state of health until 4 am this AM when she was woken up with chest pain, SOB and diaphoresis. Chest pain is described as a tightness. She tried to get up and felt very dizzy and diaphoretic. When back to sleep and then woke up again in pain. Pain worse when sitting up and better lying down. She called EMS and found to be in afib with RVR. She is feeling better since being started on a diltiazem gtt, although rates are still elevated. No LE edema, orthopnea or PND. No syncope. No excessive alcohol or caffeine intake. No illicit drug use. No recent illnesses.   In ER, ECG with afib with RVR. TSH, bnp and troponin negative. CXR with no active cardiopulmonary dz. HCG negative.   Problem List  Past Medical History:  Diagnosis Date  . Fibroid   . Hypertension    had been on lisinopril prior to pregnacy 2013-was d/c and has not been restarted on anything  . Kidney stone     Past Surgical History:  Procedure Laterality Date  . CESAREAN SECTION  10/10/2012   Procedure: CESAREAN SECTION;  Surgeon: Frederico Hamman, MD;  Location: [facility];  Service: Obstetrics;  Laterality: N/A;  . DILITATION & CURRETTAGE/HYSTROSCOPY WITH HYDROTHERMAL ABLATION N/A 10/16/2015   Procedure: DILATATION & CURETTAGE/HYSTEROSCOPY WITH HYDROTHERMAL ABLATION;  Surgeon: Frederico Hamman, MD;  Location: [facility];  Service: Gynecology;  Laterality: N/A;  . LAPAROSCOPIC TUBAL LIGATION  01/18/2013   Procedure: LAPAROSCOPIC TUBAL LIGATION;  Surgeon: Frederico Hamman, MD;  Location: [facility];  Service: Gynecology;  Laterality: Bilateral;  . SPINE SURGERY  February 2011   C6 metal bracing   . TUBAL LIGATION       Allergies  Allergies  Allergen Reactions  . Lactose Other (See Comments)    ABDOMINAL  BLOATING AND  GAS      Home Medications  Prior to Admission medications   Medication Sig Start Date End Date Taking? Authorizing Provider  Aspirin-Salicylamide-Caffeine (BC HEADACHE POWDER PO) Take 1 packet by mouth every 6 (six) hours as needed (for pain).   Yes Historical Provider, MD  traMADol (ULTRAM) 50 MG tablet Take 150 mg by mouth every 8 hours as needed for pain. 10/13/16  Yes Historical Provider, MD  HYDROcodone-acetaminophen (NORCO) 5-325 MG tablet Take 1 tablet by mouth every 6 (six) hours as needed for severe pain. Patient not taking: Reported on 11/29/2016 07/23/16   Mercedes Camprubi-Soms, PA-C  naproxen (NAPROSYN) 500 MG tablet Take 1 tablet (500 mg total) by mouth 2 (two) times daily with a meal. x5-7 days Patient not taking: Reported on 11/29/2016 07/23/16   Mercedes Camprubi-Soms, PA-C  predniSONE (DELTASONE) 20 MG tablet 3 tabs po daily x 4 days Patient not taking: Reported on 11/29/2016 07/23/16   Mercedes Camprubi-Soms, PA-C    Family History  Family History  Problem Relation Age of Onset  . Hypertension Mother   . Heart disease Mother   . Arthritis Mother   . Hyperlipidemia Mother   .  Hypertension Maternal Grandmother   . Heart disease Maternal Grandmother   . Arthritis Maternal Grandmother   . Hypertension Maternal Grandfather   . Heart disease Maternal Grandfather   . Anesthesia problems Neg Hx   . Other Neg Hx    Family Status  Relation Status  . Mother   . Maternal Grandmother   . Maternal Grandfather   . Neg Hx      Social History  Social History   Social History  . Marital status: Married    Spouse name: N/A  . Number of children: N/A  . Years of education: N/A   Occupational History  .  Not on file.   Social History Main Topics  . Smoking status: Current Every Day Smoker    Packs/day: 1.00    Years: 20.00    Types: Cigarettes    Last attempt to quit: 01/28/2012  . Smokeless tobacco: Never Used  . Alcohol use No  . Drug use: No  . Sexual activity: Yes    Birth control/ protection: Surgical     Comment: tubal at 6 weeks   Other Topics Concern  . Not on file   Social History Narrative  . No narrative on file     Review of Systems General:  No chills, fever, night sweats or weight changes.  Cardiovascular:  +++chest pain, dyspnea on exertion, NO edema, orthopnea, palpitations, paroxysmal nocturnal dyspnea. Dermatological: No rash, lesions/masses Respiratory: No cough, +++dyspnea Urologic: No hematuria, dysuria Abdominal:   No nausea, vomiting, diarrhea, bright red blood per rectum, melena, or hematemesis Musculoskeletal: Significant low back pain and currently seeing a neurosurgeon in addition she has a prior history of cervical disc disease. Neurologic:  No visual changes, wkns, changes in mental status. All other systems reviewed and are otherwise negative except as noted above.  Physical Exam  Blood pressure 105/94, pulse 88, temperature 97.9 F (36.6 C), resp. rate 23, last menstrual period 11/21/2016, SpO2 100 %.  General: Pleasant, NAD Psych: Normal affect. Neuro: Alert and oriented X 3. Moves all extremities spontaneously. HEENT: Normal  Neck: Supple without bruits or JVD. Lungs:  Resp regular and unlabored, CTA. Heart: irreg irreg, tachy no s3, s4, or murmurs. Abdomen: Soft, non-tender, non-distended, BS + x 4.  Extremities: No clubbing, cyanosis or edema. DP/PT/Radials 2+ and equal bilaterally. Neurologic:  Alert and oriented, cranial nerves intact,  No motor or sensory deficits.   Labs   Recent Labs  11/29/16 0616  TROPONINI <0.03   Lab Results  Component Value Date   WBC 3.2 (L) 11/29/2016   HGB 12.2 11/29/2016   HCT 34.1 (L)  11/29/2016   MCV 82.0 11/29/2016   PLT 238 11/29/2016    Recent Labs Lab 11/29/16 0616  NA 139  K 3.8  CL 105  CO2 23  BUN 15  CREATININE 1.01*  CALCIUM 9.3  GLUCOSE 91    Radiology/Studies  Dg Chest 2 View  Result Date: 11/29/2016 CLINICAL DATA:  Generalized chest pain EXAM: CHEST  2 VIEW COMPARISON:  01/29/2012 FINDINGS: Heart and mediastinal contours are within normal limits. No focal opacities or effusions. No acute bony abnormality. IMPRESSION: No active cardiopulmonary disease. Electronically Signed   By: Rolm Baptise M.D.   On: 11/29/2016 07:35    ECG  afib with RVR HR 140, mild diffuse ST depression  ASSESSMENT AND PLAN  Reiko Audibert is a 40 y.o. female with a history of untreated HTN, gout, obesity and chronic back pain on disability who presented to  [facility] ED today with chest pain and palpitations.  New onset atrial fibrillation with RVR: bnp and troponin negative. CXR with no active cardiopulmonary dz. Continue to cycle enzymes given chest pain.  -- Started on diltiazem load and infusion, now going at 15mg /hr. HR still elevated.  -- TSH normal. Will order 2D ECHO -- CHADS VASC of at least 2 (HTN, F sex). Will start Eliquis 5mg  BID. Very symptomatic with afib, may need TEE/DCCV if does not cardiovert spontaneously.  -- May need OSA work up as an outpatient  HTN: BP have been up and down. Will treat with rate controlling agents given afib with RVR  Hypokalemia: will supplement    Signed, Angelena Form, PA-C 11/29/2016, 8:44 AM  Pager [phone]   Patient seen and examined, history updated above.  She had the fairly sudden onset of symptomatic chest discomfort weakness and presented with rapid atrial fibrillation.  She is quite symptomatic with atrial fibrillation.  Her husband does note that she snores and she is significantly obese.  She has significant low-back pain that has been problematic for her.  She also smokes.   She has a prior history of  hypertension but discontinued her blood pressure medicines on her own about 6 months ago and has been monitoring her blood pressures.   There is a family history of cardiovascular disease with father having had a heart attack and mother also having had a history of stents and strokes.  She currently is comfortable but is still symptomatic with palpitations.  She is not currently having chest pain.  1.  Paroxysmal atrial fibrillation probable recent onset by history 2.  Hypertension currently not taking medications 3.  Obesity 4.  Probable sleep apnea 5.  Significant chronic low back pain  Recommendations:  Her CHA2DS2VASC score is 2.  I will add beta blockers to her regimen and she has already been started on anticoagulation.  Thyroids are normal.  Await results of echocardiogram.  Keep nothing by mouth after midnight for possible cardioversion if she does not convert spontaneously.  I suspect that this is relatively recent less than 24 hour onset of atrial fibrillation although there were some question about this by the emergency room physician.  She will need to be tested for sleep apnea and we discussed the importance of weight loss as well as good blood pressure control.  Kerry Hough MD [facility] 11:26 AM

## 2016-11-29 NOTE — ED Triage Notes (Signed)
Pt was awaken around 0500 with palpitations in chest. Also had diaphoresis and "didnt feel right."

## 2016-11-29 NOTE — ED Provider Notes (Signed)
[facility] DEPT Provider Note   CSN: [id number] Arrival date & time: 11/29/16  0546     History   Chief Complaint Chief Complaint  Patient presents with  . Palpitations    HPI Darlene Black is a 40 y.o. female.  Patient presents by EMS with chest pain, palpitations and diaphoresis that started early this morning.  She has a difficult time determining when symptoms started.  Her husband states he was alerted that she felt poorly about 4 am. She states she was already awake and not woken from sleep.  Found to be in Afib with RVR by EMS.  Her chest pain is L sided and constant, associated with nausea, diaphoresis, and SOB.  No cardiac history. No PND or orthopnea. No excessive caffeine or drug use. No history of Afib.   The history is provided by the patient.    Past Medical History:  Diagnosis Date  . Fibroid   . Hypertension    had been on lisinopril prior to pregnacy 2013-was d/c and has not been restarted on anything  . Kidney stone     There are no active problems to display for this patient.   Past Surgical History:  Procedure Laterality Date  . CESAREAN SECTION  10/10/2012   Procedure: CESAREAN SECTION;  Surgeon: Frederico Hamman, MD;  Location: [facility];  Service: Obstetrics;  Laterality: N/A;  . DILITATION & CURRETTAGE/HYSTROSCOPY WITH HYDROTHERMAL ABLATION N/A 10/16/2015   Procedure: DILATATION & CURETTAGE/HYSTEROSCOPY WITH HYDROTHERMAL ABLATION;  Surgeon: Frederico Hamman, MD;  Location: [facility];  Service: Gynecology;  Laterality: N/A;  . LAPAROSCOPIC TUBAL LIGATION  01/18/2013   Procedure: LAPAROSCOPIC TUBAL LIGATION;  Surgeon: Frederico Hamman, MD;  Location: [facility];  Service: Gynecology;  Laterality: Bilateral;  . SPINE SURGERY  February 2011   C6 metal bracing   . TUBAL LIGATION      OB History    Gravida Para Term Preterm AB Living   2 2 2  0 0 2   SAB TAB Ectopic Multiple Live Births   0 0 0 0 2       Home Medications    Prior to  Admission medications   Medication Sig Start Date End Date Taking? Authorizing Provider  HYDROcodone-acetaminophen (NORCO) 5-325 MG tablet Take 1 tablet by mouth every 6 (six) hours as needed for severe pain. Patient not taking: Reported on 10/20/2016 07/23/16   Mercedes Camprubi-Soms, PA-C  naproxen (NAPROSYN) 500 MG tablet Take 1 tablet (500 mg total) by mouth 2 (two) times daily with a meal. x5-7 days Patient not taking: Reported on 10/20/2016 07/23/16   Mercedes Camprubi-Soms, PA-C  predniSONE (DELTASONE) 20 MG tablet 3 tabs po daily x 4 days Patient not taking: Reported on 10/20/2016 07/23/16   Mercedes Camprubi-Soms, PA-C  traMADol (ULTRAM) 50 MG tablet Take 1-2 tablets every 8 hours as needed for pain. 10/13/16   Historical Provider, MD    Family History Family History  Problem Relation Age of Onset  . Hypertension Mother   . Heart disease Mother   . Arthritis Mother   . Hyperlipidemia Mother   . Hypertension Maternal Grandmother   . Heart disease Maternal Grandmother   . Arthritis Maternal Grandmother   . Hypertension Maternal Grandfather   . Heart disease Maternal Grandfather   . Anesthesia problems Neg Hx   . Other Neg Hx     Social History Social History  Substance Use Topics  . Smoking status: Current Every Day Smoker    Packs/day: 1.00  Years: 20.00    Types: Cigarettes    Last attempt to quit: 01/28/2012  . Smokeless tobacco: Never Used  . Alcohol use No     Allergies   Lactose   Review of Systems Review of Systems  Constitutional: Negative for activity change, appetite change and fever.  HENT: Negative for congestion and rhinorrhea.   Respiratory: Positive for chest tightness and shortness of breath. Negative for cough.   Cardiovascular: Positive for chest pain and palpitations.  Gastrointestinal: Positive for nausea. Negative for abdominal pain and vomiting.  Genitourinary: Negative for vaginal bleeding and vaginal discharge.  Musculoskeletal: Negative  for arthralgias and myalgias.  Skin: Negative for color change.  Neurological: Negative for dizziness, weakness and headaches.  A complete 10 system review of systems was obtained and all systems are negative except as noted in the HPI and PMH.    Physical Exam Updated Vital Signs BP 129/76 (BP Location: Right Arm)   Pulse 88   Temp 97.9 F (36.6 C)   Resp 20   LMP 11/21/2016   SpO2 99%   Physical Exam  Constitutional: She is oriented to person, place, and time. She appears well-developed and well-nourished. No distress.  HENT:  Head: Normocephalic and atraumatic.  Mouth/Throat: Oropharynx is clear and moist. No oropharyngeal exudate.  Eyes: Conjunctivae and EOM are normal. Pupils are equal, round, and reactive to light.  Neck: Normal range of motion. Neck supple.  No meningismus.  Cardiovascular: Normal rate, regular rhythm and intact distal pulses.   No murmur heard. Irregular tachycardia  Pulmonary/Chest: Effort normal and breath sounds normal. No respiratory distress. She exhibits tenderness.  Abdominal: Soft. There is no tenderness. There is no rebound and no guarding.  Musculoskeletal: Normal range of motion. She exhibits no edema or tenderness.  Neurological: She is alert and oriented to person, place, and time. No cranial nerve deficit. She exhibits normal muscle tone. Coordination normal.  No ataxia on finger to nose bilaterally. No pronator drift. 5/5 strength throughout. CN 2-12 intact.Equal grip strength. Sensation intact.   Skin: Skin is warm.  Psychiatric: She has a normal mood and affect. Her behavior is normal.  Nursing note and vitals reviewed.    ED Treatments / Results  Labs (all labs ordered are listed, but only abnormal results are displayed) Labs Reviewed  BASIC METABOLIC PANEL - Abnormal; Notable for the following:       Result Value   Creatinine, Ser 1.01 (*)    All other components within normal limits  CBC WITH DIFFERENTIAL/PLATELET - Abnormal;  Notable for the following:    WBC 3.2 (*)    HCT 34.1 (*)    Neutro Abs 1.5 (*)    All other components within normal limits  TROPONIN I  BRAIN NATRIURETIC PEPTIDE  TSH  CBC  I-STAT BETA HCG BLOOD, ED ([facility], [facility], [facility] ONLY)    EKG  EKG Interpretation  Date/Time:  Sunday November 29 2016 05:54:59 EST Ventricular Rate:  142 PR Interval:    QRS Duration: 72 QT Interval:  300 QTC Calculation: 462 R Axis:   54 Text Interpretation:  Atrial fibrillation Minimal ST depression, diffuse leads Atrial fibrillation Confirmed by Wyvonnia Dusky  MD, Lester Crickenberger [phone]) on 11/29/2016 6:42:00 AM       Radiology Dg Chest 2 View  Result Date: 11/29/2016 CLINICAL DATA:  Generalized chest pain EXAM: CHEST  2 VIEW COMPARISON:  01/29/2012 FINDINGS: Heart and mediastinal contours are within normal limits. No focal opacities or effusions. No acute bony abnormality. IMPRESSION:  No active cardiopulmonary disease. Electronically Signed   By: Rolm Baptise M.D.   On: 11/29/2016 07:35    Procedures Procedures (including critical care time)  Medications Ordered in ED Medications  diltiazem (CARDIZEM) 1 mg/mL load via infusion 10 mg (10 mg Intravenous Bolus from Bag 11/29/16 0646)    And  diltiazem (CARDIZEM) 100 mg in dextrose 5 % 100 mL (1 mg/mL) infusion (10 mg/hr Intravenous Bolus from Bag 11/29/16 0747)     Initial Impression / Assessment and Plan / ED Course  I have reviewed the triage vital signs and the nursing notes.  Pertinent labs & imaging results that were available during my care of the patient were reviewed by me and considered in my medical decision making (see chart for details).  Clinical Course   new onset Afib with RVR, associated with chest pain and nausea.  EKG with inferior ST depressions, likely rate related.  Symptoms started around 4am but patient is not certain.  She declines emergent cardioversion.  Cardizem gtt started. Troponin negative.  Rates remain elevated in 120-160s.   Cardizem titrated. BP remains acceptable. D/w Dr. Wynonia Lawman who will evaluate.  CRITICAL CARE Performed by: Ezequiel Essex Total critical care time: 35 minutes Critical care time was exclusive of separately billable procedures and treating other patients. Critical care was necessary to treat or prevent imminent or life-threatening deterioration. Critical care was time spent personally by me on the following activities: development of treatment plan with patient and/or surrogate as well as nursing, discussions with consultants, evaluation of patient's response to treatment, examination of patient, obtaining history from patient or surrogate, ordering and performing treatments and interventions, ordering and review of laboratory studies, ordering and review of radiographic studies, pulse oximetry and re-evaluation of patient's condition.   Final Clinical Impressions(s) / ED Diagnoses   Final diagnoses:  Atrial fibrillation with RVR [facility])    New Prescriptions New Prescriptions   No medications on file     Ezequiel Essex, MD 11/29/16 2250

## 2016-11-29 NOTE — ED Notes (Signed)
Cardiology made aware of patient BP not improving after bolus and conversion to NSR. Advised to give another 500 cc bolus and changing bed request to stepdown.

## 2016-11-29 NOTE — Progress Notes (Signed)
  Echocardiogram 2D Echocardiogram has been performed.  Darlene Black 11/29/2016, 3:14 PM

## 2016-11-29 NOTE — Progress Notes (Signed)
    Called by RN due to atrial fib with slow VR and hypotension and chest pain. BP/HR did not improved with 500 cc NS. I went down to assess patient. While in room she converted to NSR and BP improved. Now feeling much better.  Angelena Form PA-C  [facility]

## 2016-11-29 NOTE — ED Notes (Signed)
Delay in lab draw,  Pt using bathroom. 

## 2016-11-29 NOTE — ED Notes (Signed)
Patient transported to X-ray 

## 2016-11-29 NOTE — ED Notes (Signed)
Cardiology PA Katie at bedside to assess.

## 2016-11-29 NOTE — ED Notes (Signed)
Pt converted to NSR with Cards present. New EKG obtained and shown to MD [facility]. Pt reports she feels "much better."

## 2016-11-29 NOTE — ED Notes (Signed)
MD Wynonia Lawman notified of patient drop in BP and HR, from 120 HR to 40-50's, still atrial fibrillation/atrial flutter, BP in the 70's. Advised to give fluid bolus and re-assess.

## 2016-11-29 NOTE — ED Notes (Signed)
MD at bedside updating patient and plan for cardiology to come assess.

## 2016-11-30 DIAGNOSIS — I4891 Unspecified atrial fibrillation: Secondary | ICD-10-CM

## 2016-11-30 LAB — LIPID PANEL
CHOL/HDL RATIO: 3 ratio
CHOLESTEROL: 143 mg/dL (ref 0–200)
HDL: 48 mg/dL (ref >40)
LDL Cholesterol: 81 mg/dL (ref 0–99)
Triglycerides: 72 mg/dL (ref <150)
VLDL: 14 mg/dL (ref 0–40)

## 2016-11-30 LAB — CBC
HEMATOCRIT: 29.3 % — AB (ref 36.0–46.0)
Hemoglobin: 10.3 g/dL — ABNORMAL LOW (ref 12.0–15.0)
MCH: 28.9 pg (ref 26.0–34.0)
MCHC: 35.2 g/dL (ref 30.0–36.0)
MCV: 82.3 fL (ref 78.0–100.0)
PLATELETS: 213 10*3/uL (ref 150–400)
RBC: 3.56 MIL/uL — ABNORMAL LOW (ref 3.87–5.11)
RDW: 12.6 % (ref 11.5–15.5)
WBC: 2.9 10*3/uL — AB (ref 4.0–10.5)

## 2016-11-30 LAB — BASIC METABOLIC PANEL
Anion gap: 5 (ref 5–15)
BUN: 16 mg/dL (ref 6–20)
CALCIUM: 8.4 mg/dL — AB (ref 8.9–10.3)
CO2: 23 mmol/L (ref 22–32)
CREATININE: 0.78 mg/dL (ref 0.44–1.00)
Chloride: 109 mmol/L (ref 101–111)
GFR calc Af Amer: >60 mL/min (ref >60)
GLUCOSE: 85 mg/dL (ref 65–99)
Potassium: 4.1 mmol/L (ref 3.5–5.1)
SODIUM: 137 mmol/L (ref 135–145)

## 2016-11-30 LAB — PROTIME-INR
INR: 1.05
Prothrombin Time: 13.7 seconds (ref 11.4–15.2)

## 2016-11-30 LAB — TROPONIN I

## 2016-11-30 MED ORDER — AMLODIPINE BESYLATE 5 MG PO TABS: 5.0000 mg | ORAL_TABLET | Freq: Every day | ORAL | 3 refills | Status: DC

## 2016-11-30 MED ORDER — METOPROLOL TARTRATE 50 MG PO TABS: 50.0000 mg | ORAL_TABLET | Freq: Two times a day (BID) | ORAL | 3 refills | Status: DC

## 2016-11-30 MED ORDER — AMLODIPINE BESYLATE 5 MG PO TABS
5.0000 mg | ORAL_TABLET | Freq: Every day | ORAL | Status: DC
  Administered 2016-11-30: 5 mg via ORAL
  Filled 2016-11-30: qty 1

## 2016-11-30 MED ORDER — APIXABAN 5 MG PO TABS: 5.0000 mg | ORAL_TABLET | Freq: Two times a day (BID) | ORAL | 3 refills | Status: DC

## 2016-11-30 MED ORDER — TRAMADOL HCL 50 MG PO TABS: 50.0000 mg | ORAL_TABLET | Freq: Three times a day (TID) | ORAL | 0 refills | Status: DC

## 2016-11-30 NOTE — Care Management Note (Signed)
Case Management Note  Patient Details  Name: Josephine Lafrance MRN: [id number] Date of Birth: June 10, 1976  Subjective/Objective:                 Spoke with patient at the bedside. Provided with Eliquis card and instructions for use. Also provided with information how to start patient assistance if [employer] does not go through as patient expects.    Action/Plan:  DC to home.   Expected Discharge Date:                  Expected Discharge Plan:  Home/Self Care  In-House Referral:     Discharge planning Services  CM Consult, Medication Assistance (Eliquis card)  Post Acute Care Choice:    Choice offered to:     DME Arranged:    DME Agency:     HH Arranged:    HH Agency:     Status of Service:  Completed, signed off  If discussed at [employer] of Stay Meetings, dates discussed:    Additional Comments:  Carles Collet, RN 11/30/2016, 11:17 AM

## 2016-11-30 NOTE — Discharge Instructions (Signed)
Atrial Fibrillation °Introduction °Atrial fibrillation is a type of heartbeat that is irregular or fast (rapid). If you have this condition, your heart keeps quivering in a weird (chaotic) way. This condition can make it so your heart cannot pump blood normally. Having this condition gives a person more risk for stroke, heart failure, and other heart problems. There are different types of atrial fibrillation. Talk with your doctor to learn about the type that you have. °Follow these instructions at home: °· Take over-the-counter and prescription medicines only as told by your doctor. °· If your doctor prescribed a blood-thinning medicine, take it exactly as told. Taking too much of it can cause bleeding. If you do not take enough of it, you will not have the protection that you need against stroke and other problems. °· Do not use any tobacco products. These include cigarettes, chewing tobacco, and e-cigarettes. If you need help quitting, ask your doctor. °· If you have apnea (obstructive sleep apnea), manage it as told by your doctor. °· Do not drink alcohol. °· Do not drink beverages that have caffeine. These include coffee, soda, and tea. °· Maintain a healthy weight. Do not use diet pills unless your doctor says they are safe for you. Diet pills may make heart problems worse. °· Follow diet instructions as told by your doctor. °· Exercise regularly as told by your doctor. °· Keep all follow-up visits as told by your doctor. This is important. °Contact a doctor if: °· You notice a change in the speed, rhythm, or strength of your heartbeat. °· You are taking a blood-thinning medicine and you notice more bruising. °· You get tired more easily when you move or exercise. °Get help right away if: °· You have pain in your chest or your belly (abdomen). °· You have sweating or weakness. °· You feel sick to your stomach (nauseous). °· You notice blood in your throw up (vomit), poop (stool), or pee (urine). °· You are  short of breath. °· You suddenly have swollen feet and ankles. °· You feel dizzy. °· Your suddenly get weak or numb in your face, arms, or legs, especially if it happens on one side of your body. °· You have trouble talking, trouble understanding, or both. °· Your face or your eyelid droops on one side. °These symptoms may be an emergency. Do not wait to see if the symptoms will go away. Get medical help right away. Call your local emergency services (911 in the [place] Do not drive yourself to the hospital.  °This information is not intended to replace advice given to you by your health care provider. Make sure you discuss any questions you have with your health care provider. °Document Released: 09/08/2008 Document Revised: 05/07/2016 Document Reviewed: 03/27/2015 °© 2017 [employer] ° °

## 2016-11-30 NOTE — Progress Notes (Signed)
Patient Name: Darlene Black Date of Encounter: 11/30/2016  Primary Cardiologist:   Hospital Problem List     Active Problems:   Paroxysmal atrial fibrillation (HCC)   Hypertension   Obesity (BMI 30-39.9)   Current use of long term anticoagulation   Lumbar disc disease   Atrial fibrillation with rapid ventricular response ([place])     Subjective   She is converted to sinus rhythm. Echo shows normal left ventricular function.  Inpatient Medications    Scheduled Meds: . apixaban  5 mg Oral BID  . metoprolol tartrate  50 mg Oral BID  . traMADol  50 mg Oral Q8H   Continuous Infusions: . diltiazem (CARDIZEM) infusion Stopped (11/29/16 1240)  . diltiazem (CARDIZEM) infusion     PRN Meds: acetaminophen, ondansetron (ZOFRAN) IV   Vital Signs    Vitals:   11/29/16 2102 11/29/16 2346 11/30/16 0546 11/30/16 0756  BP: 119/74 140/84 (!) 144/88 (!) 143/90  Pulse: 75 75 68 75  Resp: 18  20 13   Temp: 98 F (36.7 C) 98.6 F (37 C) 98.1 F (36.7 C) 98.7 F (37.1 C)  TempSrc: Oral Oral Oral Oral  SpO2: 100% 100% 99% 97%  Weight:   220 lb 14.4 oz (100.2 kg)   Height:        Intake/Output Summary (Last 24 hours) at 11/30/16 0837 Last data filed at 11/29/16 2115  Gross per 24 hour  Intake              940 ml  Output                0 ml  Net              940 ml   Filed Weights   11/29/16 1438 11/30/16 0546  Weight: 219 lb 8 oz (99.6 kg) 220 lb 14.4 oz (100.2 kg)    Physical Exam    Labs    CBC  Recent Labs  11/29/16 0616 11/30/16 0011  WBC 3.2* 2.9*  NEUTROABS 1.5*  --   HGB 12.2 10.3*  HCT 34.1* 29.3*  MCV 82.0 82.3  PLT 238 [id number]   Basic Metabolic Panel  Recent Labs  11/29/16 0616 11/30/16 0011  NA 139 137  K 3.8 4.1  CL 105 109  CO2 23 23  GLUCOSE 91 85  BUN 15 16  CREATININE 1.01* 0.78  CALCIUM 9.3 8.4*   Liver Function Tests No results for input(s): AST, ALT, ALKPHOS, BILITOT, PROT, ALBUMIN in the last 72 hours. No results for input(s):  LIPASE, AMYLASE in the last 72 hours. Cardiac Enzymes  Recent Labs  11/29/16 1336 11/29/16 1821 11/30/16 0011  TROPONINI <0.03 <0.03 <0.03   BNP Invalid input(s): POCBNP D-Dimer No results for input(s): DDIMER in the last 72 hours. Hemoglobin A1C No results for input(s): HGBA1C in the last 72 hours. Fasting Lipid Panel  Recent Labs  11/30/16 0011  CHOL 143  HDL 48  LDLCALC 81  TRIG 72  CHOLHDL 3.0   Thyroid Function Tests  Recent Labs  11/29/16 0616  TSH 1.996    Telemetry     - Personally Reviewed   There is sinus rhythm.  ECG      Radiology    Dg Chest 2 View  Result Date: 11/29/2016 CLINICAL DATA:  Generalized chest pain EXAM: CHEST  2 VIEW COMPARISON:  01/29/2012 FINDINGS: Heart and mediastinal contours are within normal limits. No focal opacities or effusions. No acute bony abnormality. IMPRESSION: No active cardiopulmonary  disease. Electronically Signed   By: Rolm Baptise M.D.   On: 11/29/2016 07:35    Cardiac Studies     Patient Profile     New onset atrial fibrillation with spontaneous conversion to sinus rhythm. Anticoagulation has been started.  Assessment & Plan       Paroxysmal atrial fibrillation [facility])     Patient has converted spontaneously to sinus rhythm. Anticoagulation has been added.    Hypertension   Obesity (BMI 30-39.9)    Current use of long term anticoagulation     This will need to be continued long-term.   Lumbar disc disease   Atrial fibrillation with rapid ventricular response (HCC)   Dola Argyle, MD  11/30/2016, 8:37 AM

## 2016-11-30 NOTE — Discharge Summary (Signed)
Patient ID: Darlene Black,  MRN: [id number], DOB/AGE: 1976/02/23 40 y.o.  Admit date: 11/29/2016 Discharge date: 11/30/2016  Primary Care Provider: Vicenta Aly, [place] Primary Cardiologist: Dr Wynonia Lawman  Discharge Diagnoses Active Problems:   Paroxysmal atrial fibrillation (HCC)   Hypertension   Obesity (BMI 30-39.9)   Current use of long term anticoagulation   Lumbar disc disease   Atrial fibrillation with rapid ventricular response [facility])    Procedures: Echo 11/29/16   Hospital Course:  40 y.o. female with a history of untreated HTN, gout, obesity (suspected sleep apnea by history) and chronic back pain on disability who presented to [facility] ED 11/29/16 with chest pain and palpitations and found to be in afib with RVR. She was started on IV Diltiazem for rate control and Eliquis for anticoagulation. She was felt to be a CHADs VASc=2 (HTN, sex). Echo done 12/17 showed normal LVF with moderate LVH. She converted spontaneously to NSR and will be discharged 11/30/16. Norvasc 5 mg added at discharge for B/P. She'll need an OP sleep study at some point.   Discharge Vitals:  Blood pressure (!) 143/90, pulse 75, temperature 98.7 F (37.1 C), temperature source Oral, resp. rate 13, height 5\' 2"  (1.575 m), weight 220 lb 14.4 oz (100.2 kg), last menstrual period 11/21/2016, SpO2 97 %.  Gen: Obese female NAD CV: RRR Chest: clear Neuro: WNL  Labs: Results for orders placed or performed during the hospital encounter of 11/29/16 (from the past 24 hour(s))  Troponin I     Status: None   Collection Time: 11/29/16  1:36 PM  Result Value Ref Range   Troponin I <0.03 <0.03 ng/mL  Troponin I     Status: None   Collection Time: 11/29/16  6:21 PM  Result Value Ref Range   Troponin I <0.03 <0.03 ng/mL  Troponin I     Status: None   Collection Time: 11/30/16 12:11 AM  Result Value Ref Range   Troponin I <0.03 <0.03 ng/mL  Basic metabolic panel     Status: Abnormal   Collection Time:  11/30/16 12:11 AM  Result Value Ref Range   Sodium 137 135 - 145 mmol/L   Potassium 4.1 3.5 - 5.1 mmol/L   Chloride 109 101 - 111 mmol/L   CO2 23 22 - 32 mmol/L   Glucose, Bld 85 65 - 99 mg/dL   BUN 16 6 - 20 mg/dL   Creatinine, Ser 0.78 0.44 - 1.00 mg/dL   Calcium 8.4 (L) 8.9 - 10.3 mg/dL   GFR calc non Af Amer >60 >60 mL/min   GFR calc Af Amer >60 >60 mL/min   Anion gap 5 5 - 15  CBC     Status: Abnormal   Collection Time: 11/30/16 12:11 AM  Result Value Ref Range   WBC 2.9 (L) 4.0 - 10.5 K/uL   RBC 3.56 (L) 3.87 - 5.11 MIL/uL   Hemoglobin 10.3 (L) 12.0 - 15.0 g/dL   HCT 29.3 (L) 36.0 - 46.0 %   MCV 82.3 78.0 - 100.0 fL   MCH 28.9 26.0 - 34.0 pg   MCHC 35.2 30.0 - 36.0 g/dL   RDW 12.6 11.5 - 15.5 %   Platelets 213 150 - 400 K/uL  Protime-INR     Status: None   Collection Time: 11/30/16 12:11 AM  Result Value Ref Range   Prothrombin Time 13.7 11.4 - 15.2 seconds   INR 1.05   Lipid panel     Status: None   Collection Time:  11/30/16 12:11 AM  Result Value Ref Range   Cholesterol 143 0 - 200 mg/dL   Triglycerides 72 <150 mg/dL   HDL 48 >40 mg/dL   Total CHOL/HDL Ratio 3.0 RATIO   VLDL 14 0 - 40 mg/dL   LDL Cholesterol 81 0 - 99 mg/dL    Disposition:  Follow-up Information    W Tollie Eth, MD Follow up.   Specialty:  Cardiology Why:  Call office for appointment Contact information: [address] [place] [address] [phone]           Discharge Medications:  Allergies as of 11/30/2016      Reactions   Lactose Other (See Comments)   ABDOMINAL  BLOATING AND  GAS       Medication List    STOP taking these medications   BC HEADACHE POWDER PO   HYDROcodone-acetaminophen 5-325 MG tablet Commonly known as:  NORCO   naproxen 500 MG tablet Commonly known as:  NAPROSYN   predniSONE 20 MG tablet Commonly known as:  DELTASONE     TAKE these medications   amLODipine 5 MG tablet Commonly known as:  NORVASC Take 1 tablet (5 mg  total) by mouth daily.   apixaban 5 MG Tabs tablet Commonly known as:  ELIQUIS Take 1 tablet (5 mg total) by mouth 2 (two) times daily.   metoprolol 50 MG tablet Commonly known as:  LOPRESSOR Take 1 tablet (50 mg total) by mouth 2 (two) times daily.   traMADol 50 MG tablet Commonly known as:  ULTRAM Take 1 tablet (50 mg total) by mouth every 8 (eight) hours. What changed:  See the new instructions.        Duration of Discharge Encounter: Greater than 30 minutes including physician time.  Angelena Form PA-C 11/30/2016 9:07 AM   Agree with above.   Kerry Hough MD [facility]

## 2017-03-25 ENCOUNTER — Emergency Department (HOSPITAL_COMMUNITY)
Admission: EM | Admit: 2017-03-25 | Discharge: 2017-03-25 | Disposition: A | Discharge status: Home / Self Care | Payer: Medicaid Other | Attending: Emergency Medicine | Admitting: Emergency Medicine

## 2017-03-25 ENCOUNTER — Encounter (HOSPITAL_COMMUNITY): Payer: Self-pay | Admitting: Emergency Medicine

## 2017-03-25 DIAGNOSIS — M25561 Pain in right knee: Secondary | ICD-10-CM

## 2017-03-25 DIAGNOSIS — M25562 Pain in left knee: Secondary | ICD-10-CM | POA: Diagnosis not present

## 2017-03-25 DIAGNOSIS — F17210 Nicotine dependence, cigarettes, uncomplicated: Secondary | ICD-10-CM | POA: Insufficient documentation

## 2017-03-25 DIAGNOSIS — I10 Essential (primary) hypertension: Secondary | ICD-10-CM | POA: Insufficient documentation

## 2017-03-25 DIAGNOSIS — Z79899 Other long term (current) drug therapy: Secondary | ICD-10-CM | POA: Insufficient documentation

## 2017-03-25 DIAGNOSIS — Z7901 Long term (current) use of anticoagulants: Secondary | ICD-10-CM | POA: Diagnosis not present

## 2017-03-25 MED ORDER — ACETAMINOPHEN 325 MG PO TABS: 650.0000 mg | ORAL_TABLET | Freq: Four times a day (QID) | ORAL | 0 refills | Status: DC | PRN

## 2017-03-25 MED ORDER — CYCLOBENZAPRINE HCL 10 MG PO TABS: 10.0000 mg | ORAL_TABLET | Freq: Two times a day (BID) | ORAL | 0 refills | Status: DC | PRN

## 2017-03-25 NOTE — ED Provider Notes (Signed)
[facility] DEPT Provider Note   CSN: [id number] Arrival date & time: 03/25/17  0844   By signing my name below, I, Hilbert Odor, attest that this documentation has been prepared under the direction and in the presence of Domenic Moras, PA-C. Electronically Signed: Hilbert Odor, Scribe. 03/25/17. 9:59 AM. History   Chief Complaint Chief Complaint  Patient presents with  . Knee Pain    The history is provided by the patient. No language interpreter was used.  HPI Comments: Darlene Black is a 41 y.o. female with a hx of chronic back pain and HTN, who presents to the Emergency Department complaining of bilateral knee pain for the past few days. The patient states that her pain began to worsen last night. She describes the pain as a burning sensation. Pain is worse on left knee than right. She reports increased urgency. She began ambulating with a cane this morning due to the pain. The patient has a hx of back problems. She denies dysuria, fever, and chills.   Past Medical History:  Diagnosis Date  . Fibroid   . Hypertension    had been on lisinopril prior to pregnacy 2013-was d/c and has not been restarted on anything  . Kidney stone   . Lumbar disc disease   . Obesity (BMI 30-39.9)   . Paroxysmal atrial fibrillation ([place]) 11/29/2016   CHA2DS2VASC score 2    onset 11/29/2016    Patient Active Problem List   Diagnosis Date Noted  . Paroxysmal atrial fibrillation ([place]) 11/29/2016  . Hypertension 11/29/2016  . Current use of long term anticoagulation 11/29/2016  . Atrial fibrillation with rapid ventricular response ([place]) 11/29/2016  . Obesity (BMI 30-39.9)   . Lumbar disc disease     Past Surgical History:  Procedure Laterality Date  . CERVICAL LAMINECTOMY  01/2010   C6 metal bracing   . CESAREAN SECTION  10/10/2012   Procedure: CESAREAN SECTION;  Surgeon: Frederico Hamman, MD;  Location: [facility];  Service: Obstetrics;  Laterality: N/A;  . DILITATION &  CURRETTAGE/HYSTROSCOPY WITH HYDROTHERMAL ABLATION N/A 10/16/2015   Procedure: DILATATION & CURETTAGE/HYSTEROSCOPY WITH HYDROTHERMAL ABLATION;  Surgeon: Frederico Hamman, MD;  Location: [facility];  Service: Gynecology;  Laterality: N/A;  . LAPAROSCOPIC TUBAL LIGATION  01/18/2013   Procedure: LAPAROSCOPIC TUBAL LIGATION;  Surgeon: Frederico Hamman, MD;  Location: [facility];  Service: Gynecology;  Laterality: Bilateral;  . TUBAL LIGATION      OB History    Gravida Para Term Preterm AB Living   2 2 2  0 0 2   SAB TAB Ectopic Multiple Live Births   0 0 0 0 2       Home Medications    Prior to Admission medications   Medication Sig Start Date End Date Taking? Authorizing Provider  amLODipine (NORVASC) 5 MG tablet Take 1 tablet (5 mg total) by mouth daily. 11/30/16   Erlene Quan, PA-C  apixaban (ELIQUIS) 5 MG TABS tablet Take 1 tablet (5 mg total) by mouth 2 (two) times daily. 11/30/16   Erlene Quan, PA-C  metoprolol (LOPRESSOR) 50 MG tablet Take 1 tablet (50 mg total) by mouth 2 (two) times daily. 11/30/16   Erlene Quan, PA-C  traMADol (ULTRAM) 50 MG tablet Take 1 tablet (50 mg total) by mouth every 8 (eight) hours. 11/30/16   Erlene Quan, PA-C    Family History Family History  Problem Relation Age of Onset  . Hypertension Mother   . Heart disease Mother   .  Arthritis Mother   . Hyperlipidemia Mother   . Hypertension Maternal Grandmother   . Heart disease Maternal Grandmother   . Arthritis Maternal Grandmother   . Hypertension Maternal Grandfather   . Heart disease Maternal Grandfather   . Anesthesia problems Neg Hx   . Other Neg Hx     Social History Social History  Substance Use Topics  . Smoking status: Current Every Day Smoker    Packs/day: 1.00    Years: 20.00    Types: Cigarettes    Last attempt to quit: 01/28/2012  . Smokeless tobacco: Never Used  . Alcohol use No     Allergies   Lactose   Review of Systems Review of Systems  Constitutional: Negative for  chills and fever.  Genitourinary: Negative for dysuria.  Musculoskeletal: Positive for arthralgias and back pain.       Bilateral knee pain.     Physical Exam Updated Vital Signs BP (!) 155/114 (BP Location: Left Arm)   Pulse 95   Temp 98.4 F (36.9 C) (Oral)   Resp 15   Ht 5\' 2"  (1.575 m)   Wt 230 lb (104.3 kg)   SpO2 97%   BMI 42.07 kg/m   Physical Exam  Constitutional: She is oriented to person, place, and time. She appears well-developed and well-nourished.  HENT:  Head: Normocephalic.  Eyes: EOM are normal.  Neck: Normal range of motion.  Cardiovascular: Intact distal pulses.   Pulmonary/Chest: Effort normal.  Abdominal: She exhibits no distension.  Musculoskeletal: Normal range of motion. She exhibits no edema.  She ambulates with a cane. Diffuse tenderness along lumbar spine. No deformities, crepitus, or step offs noted. Normal skin tone.  Diffuse tender throughout bilateral extremities. Significant tenderness to left knee. No swelling and erythema, or palpable cords. Intact DP pulses.  Neurological: She is alert and oriented to person, place, and time. She displays normal reflexes.  Psychiatric: She has a normal mood and affect.  Nursing note and vitals reviewed.    ED Treatments / Results  DIAGNOSTIC STUDIES: Oxygen Saturation is 97% on RA, normal by my interpretation.    COORDINATION OF CARE: 9:49 AM Discussed treatment plan with pt at bedside and pt agreed to plan. I will check the patient's bilateral knee x-ray.  Labs (all labs ordered are listed, but only abnormal results are displayed) Labs Reviewed - No data to display  EKG  EKG Interpretation None       Radiology No results found.  Procedures Procedures (including critical care time)  Medications Ordered in ED Medications - No data to display   Initial Impression / Assessment and Plan / ED Course  I have reviewed the triage vital signs and the nursing notes.  Pertinent labs &  imaging results that were available during my care of the patient were reviewed by me and considered in my medical decision making (see chart for details).     BP (!) 155/114 (BP Location: Left Arm)   Pulse 95   Temp 98.4 F (36.9 C) (Oral)   Resp 15   Ht 5\' 2"  (1.575 m)   Wt 104.3 kg   SpO2 97%   BMI 42.07 kg/m    Final Clinical Impressions(s) / ED Diagnoses   Final diagnoses:  Acute pain of both knees    New Prescriptions New Prescriptions   ACETAMINOPHEN (TYLENOL) 325 MG TABLET    Take 2 tablets (650 mg total) by mouth every 6 (six) hours as needed for mild pain or moderate  pain.   CYCLOBENZAPRINE (FLEXERIL) 10 MG TABLET    Take 1 tablet (10 mg total) by mouth 2 (two) times daily as needed for muscle spasms.   10:11 AM Acute on chronic bilateral knee pain with back pain.  No red flags.  Able to ambulate with a cane.  No sign concerning for septic joint or cellulitis.  She is NVI.  RICE therapy discussed, outpt f/u recommended.  Pt currently on Eliquis for PAF, doubt DVT.   Domenic Moras, PA-C 03/25/17 [place], MD 03/25/17 [phone]

## 2017-03-25 NOTE — Discharge Instructions (Signed)
Please alternate between heat and ice to help with knee pain.  Take tylenol and flexeril as needed for pain and follow up with your orthopedist for further care.

## 2017-03-25 NOTE — ED Triage Notes (Signed)
Pt states her knees have been burning for two days. Pt states she can walk but the pain is so bad.

## 2017-04-02 ENCOUNTER — Telehealth (INDEPENDENT_AMBULATORY_CARE_PROVIDER_SITE_OTHER): Payer: Self-pay | Admitting: Orthopaedic Surgery

## 2017-04-02 NOTE — Telephone Encounter (Signed)
I received a request for records from [facility] 04-13-2016 to present.  I faxed back advising that we do not have records within requested dates.

## 2017-04-28 ENCOUNTER — Ambulatory Visit (INDEPENDENT_AMBULATORY_CARE_PROVIDER_SITE_OTHER): Payer: Medicaid Other

## 2017-04-28 ENCOUNTER — Ambulatory Visit (INDEPENDENT_AMBULATORY_CARE_PROVIDER_SITE_OTHER): Payer: Medicaid Other | Admitting: Orthopaedic Surgery

## 2017-04-28 ENCOUNTER — Telehealth (INDEPENDENT_AMBULATORY_CARE_PROVIDER_SITE_OTHER): Payer: Self-pay | Admitting: Orthopaedic Surgery

## 2017-04-28 ENCOUNTER — Encounter (INDEPENDENT_AMBULATORY_CARE_PROVIDER_SITE_OTHER): Payer: Self-pay | Admitting: Orthopaedic Surgery

## 2017-04-28 VITALS — BP 146/101 | HR 67 | Ht 62.5 in | Wt 230.0 lb

## 2017-04-28 DIAGNOSIS — G8929 Other chronic pain: Secondary | ICD-10-CM

## 2017-04-28 DIAGNOSIS — M545 Low back pain: Secondary | ICD-10-CM

## 2017-04-28 NOTE — Progress Notes (Signed)
Office Visit Note   Patient: Darlene Black           Date of Birth: 02-Jun-1976           MRN: [id number] Visit Date: 04/28/2017              Requested by: Vicenta Aly, [place], [place] [address] PCP: Vicenta Aly, FNP   Assessment & Plan: Visit Diagnoses:  1. Chronic bilateral low back pain, with sciatica presence unspecified     Plan: Patient needs to quit smoking which she's been planning to do in order to maximize her chances of successful lumbar fusion. She would have to be off her Rexene Agent for the fusion surgery for 5 days after the surgery and can talk with her cardiologist see she is a candidate for possible attempts at conversion to normal sinus rhythm.  Follow-Up Instructions: Patient can follow-up in a month we'll see her she's doing on smoking at that time. We discussed lumbar fusion procedure, bracing, hospital stay risks of surgery etc. We will see her back again in one month.  Orders:  Orders Placed This Encounter  Procedures  . XR Lumbar Spine Complete   No orders of the defined types were placed in this encounter.     Procedures: No procedures performed   Clinical Data: No additional findings.   Subjective: Chief Complaint  Patient presents with  . Lower Back - Pain    HPI 41 year old female returns with the chronic low back pain and bilateral leg pain worse in the left than right. 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 She does not have any significant stenosis at other levels. Since last seen 10/20/2016 she was diagnosed with atrial fib and is on  Eliquis .   Review of Systems positive for  chronic back pain. L5-S1 disc degeneration. Diagnosed with the atrial fibrillation on Eliquis .  Previous GYN surgery. Otherwise review of systems updated negative as it pertains history of present illness.  Objective: Vital Signs: BP (!)  146/101   Pulse 67   Ht 5' 2.5" (1.588 m)   Wt 230 lb (104.3 kg)   BMI 41.40 kg/m   Physical Exam  Constitutional: She is oriented to person, place, and time. She appears well-developed.  HENT:  Head: Normocephalic.  Right Ear: External ear normal.  Left Ear: External ear normal.  Eyes: Pupils are equal, round, and reactive to light.  Neck: No tracheal deviation present. No thyromegaly present.  Cardiovascular: Normal rate.   Pulmonary/Chest: Effort normal.  Abdominal: Soft.  Musculoskeletal:  Patient has normal heel toe gait she has tenderness sciatic notch positive straight leg raising 80 right and left. no pain with hip range of motion. Lumbar tenderness. EHL anterior tib is strong.  Neurological: She is alert and oriented to person, place, and time.  Skin: Skin is warm and dry.  Psychiatric: She has a normal mood and affect. Her behavior is normal.    Ortho Exam and ankle jerk are 1+ and symmetrical tenderness, negative Homans sign. Anterior tib EHL is strong. Posterior tibial and peroneal strength is good.  Specialty Comments:  No specialty comments available.  Imaging: No results found.   PMFS History: Patient Active Problem List   Diagnosis Date Noted  . Paroxysmal atrial fibrillation ([place]) 11/29/2016  . Hypertension 11/29/2016  . Current use of long term anticoagulation 11/29/2016  . Atrial fibrillation with rapid ventricular response (  [place]) 11/29/2016  . Obesity (BMI 30-39.9)   . Lumbar disc disease    Past Medical History:  Diagnosis Date  . Fibroid   . Hypertension    had been on lisinopril prior to pregnacy 2013-was d/c and has not been restarted on anything  . Kidney stone   . Lumbar disc disease   . Obesity (BMI 30-39.9)   . Paroxysmal atrial fibrillation ([place]) 11/29/2016   CHA2DS2VASC score 2    onset 11/29/2016    Family History  Problem Relation Age of Onset  . Hypertension Mother   . Heart disease Mother   . Arthritis Mother   .  Hyperlipidemia Mother   . Hypertension Maternal Grandmother   . Heart disease Maternal Grandmother   . Arthritis Maternal Grandmother   . Hypertension Maternal Grandfather   . Heart disease Maternal Grandfather   . Anesthesia problems Neg Hx   . Other Neg Hx     Past Surgical History:  Procedure Laterality Date  . CERVICAL LAMINECTOMY  01/2010   C6 metal bracing   . CESAREAN SECTION  10/10/2012   Procedure: CESAREAN SECTION;  Surgeon: Frederico Hamman, MD;  Location: [facility];  Service: Obstetrics;  Laterality: N/A;  . DILITATION & CURRETTAGE/HYSTROSCOPY WITH HYDROTHERMAL ABLATION N/A 10/16/2015   Procedure: DILATATION & CURETTAGE/HYSTEROSCOPY WITH HYDROTHERMAL ABLATION;  Surgeon: Frederico Hamman, MD;  Location: [facility];  Service: Gynecology;  Laterality: N/A;  . LAPAROSCOPIC TUBAL LIGATION  01/18/2013   Procedure: LAPAROSCOPIC TUBAL LIGATION;  Surgeon: Frederico Hamman, MD;  Location: [facility];  Service: Gynecology;  Laterality: Bilateral;  . TUBAL LIGATION     Social History   Occupational History  . Not on file.   Social History Main Topics  . Smoking status: Current Every Day Smoker    Packs/day: 1.00    Years: 20.00    Types: Cigarettes    Last attempt to quit: 01/28/2012  . Smokeless tobacco: Never Used  . Alcohol use No  . Drug use: No  . Sexual activity: Yes    Birth control/ protection: Surgical     Comment: tubal at 6 weeks

## 2017-04-28 NOTE — Telephone Encounter (Signed)
Ok for note 

## 2017-04-28 NOTE — Telephone Encounter (Signed)
Patient is needing a letter saying she will be out of work until June 26th. She will be back by this afternoon and was wanting to pick it up. CB # [phone] and it's okay to leave voicemail.

## 2017-04-29 NOTE — Telephone Encounter (Signed)
Note printed at my desk. Could you please stamp and place up front and call patient to pick up? Thanks.

## 2017-04-29 NOTE — Telephone Encounter (Signed)
Ok thanks 

## 2017-05-03 ENCOUNTER — Telehealth (INDEPENDENT_AMBULATORY_CARE_PROVIDER_SITE_OTHER): Payer: Self-pay | Admitting: Radiology

## 2017-05-03 NOTE — Telephone Encounter (Signed)
Patient got this note.

## 2017-05-03 NOTE — Telephone Encounter (Signed)
I called patient and advised. 

## 2017-05-03 NOTE — Telephone Encounter (Signed)
She can ask her PCP. We do not do chronic pain medication. We will order her pain meds after surgery. Needs to finish her blood thinner, quite smoking etc. as discussed.

## 2017-05-03 NOTE — Telephone Encounter (Signed)
Patient left voicemail requesting prescription for pain medication. She states that her hips are really bothering her. Please advise.  CB [phone]

## 2017-05-19 ENCOUNTER — Telehealth (INDEPENDENT_AMBULATORY_CARE_PROVIDER_SITE_OTHER): Payer: Self-pay | Admitting: Orthopaedic Surgery

## 2017-05-19 NOTE — Telephone Encounter (Signed)
I received VM from [facility] [employer] disability checking on records request. I called her back [phone], ext. [phone] lmvm advising that we did not received their request. Advised to resend to [phone]

## 2017-06-08 ENCOUNTER — Encounter (INDEPENDENT_AMBULATORY_CARE_PROVIDER_SITE_OTHER): Payer: Self-pay | Admitting: Orthopaedic Surgery

## 2017-06-08 ENCOUNTER — Ambulatory Visit (INDEPENDENT_AMBULATORY_CARE_PROVIDER_SITE_OTHER): Payer: Medicaid Other | Admitting: Orthopaedic Surgery

## 2017-06-08 VITALS — BP 143/85 | HR 70 | Ht 63.0 in | Wt 225.0 lb

## 2017-06-08 DIAGNOSIS — M519 Unspecified thoracic, thoracolumbar and lumbosacral intervertebral disc disorder: Secondary | ICD-10-CM

## 2017-06-08 NOTE — Progress Notes (Addendum)
Office Visit Note   Patient: Darlene Black           Date of Birth: 1976/05/31           MRN: [id number] Visit Date: 06/08/2017              Requested by: Vicenta Aly, [address] [place], [place] [address] PCP: Vicenta Aly, FNP   Assessment & Plan: Visit Diagnoses:  1. 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  Plan: Surgery been recommended  For the patient by her surgeon  In [place]. The procedure was denied by her insurance now she currently does not have active insurance. She is on an anticoagulant and needs to have this finished up or a decision made by cardiology to bridge or not Bridge her. She still working on obtaining some insurance and is a candidate for single level instrument  L5-S1 fusion. I plan to recheck her in one month work slip given no work 1 month.  MRI scanned images and indications for surgery as well as usual to nightstand hospital mobilization with the back brace should wear for a few months expected time out of work in the three-month range was discussed and reviewed. We discussed working on some weight loss and addition with her BMI greater than 39. Also follow-up 1 month.  Follow-Up Instructions: Return in about 1 month (around 07/08/2017).   Orders:  No orders of the defined types were placed in this encounter.  No orders of the defined types were placed in this encounter.     Procedures: No procedures performed   Clinical Data: No additional findings.   Subjective: Chief Complaint  Patient presents with  . Lower Back - Pain, Follow-up    HPI patient returned she states she successfully quit smoking a month ago. She states her pain remains significant and she is ready to consider surgical intervention. She had some intermittent atrial fibrillation and is on an anticoagulant for at least another few months. She is in regular  rhythm today and she has appointment with her cardiologist coming up shortly. Patient is combination of congenital lumbar stenosis as well as bilateral foraminal stenosis at L5-S1 with retrolisthesis and acquired stenosis. We have discussed single level decompression and fusion. Patient originally referred on 10/20/2016 by her disability insurance company. She is out of work since March 2017 and has been through epidural steroid injections. Patient complains of pain when she stands she cannot sit long and has  pain when she turns or twists, she ambulates with a limp if she walks too much she's been using tramadol for pain and with standing or prolonged walking she states her pain is rated as severe. Levels above L5-S1 in the lumbar spine on MRI did not show any significant narrowing or disc disease.  Review of Systems positive for L5-S1 congenital and acquired stenosis with foraminal stenosis and retrolisthesis, facet arthropathy and diffuse disc bulge, instability. Intermittent atrial fib currently on Eliquis.  Previous GYN surgery otherwise negative as pertains to history of present illness.   Objective: Vital Signs: BP (!) 143/85   Pulse 70   Ht 5\' 3"  (1.6 m)   Wt 225 lb (102.1 kg)   BMI 39.86 kg/m   Physical Exam  Constitutional: She is oriented to person, place, and time. She appears well-developed.  HENT:  Head: Normocephalic.  Right Ear: External ear normal.  Left Ear: External ear normal.  Eyes: Pupils are equal, round, and reactive to light.  Neck: No tracheal deviation present. No thyromegaly present.  Cardiovascular: Normal rate, regular rhythm and normal heart sounds.   Pulmonary/Chest: Effort normal.  Abdominal: Soft.  Musculoskeletal:  Patient some sciatic notch tenderness.  Neurological: She is alert and oriented to person, place, and time.  Skin: Skin is warm and dry.  Psychiatric: She has a normal mood and affect. Her behavior is normal.    Ortho Exam patient ambulates  slightly forward flexed. She is some pain with straight leg raising 80 right and left. Normal hip range of motion negative Corky Sox. Anterior tib EHL gastrocsoleus is intact. Sensation is intact no rash overexposed skin no lymphadenopathy.  Specialty Comments:  No specialty comments available.  Imaging: No results found.   PMFS History: Patient Active Problem List   Diagnosis Date Noted  . Paroxysmal atrial fibrillation ([facility]) 11/29/2016  . Hypertension 11/29/2016  . Current use of long term anticoagulation 11/29/2016  . Atrial fibrillation with rapid ventricular response ([facility]) 11/29/2016  . Obesity (BMI 30-39.9)   . Lumbar disc disease    Past Medical History:  Diagnosis Date  . Fibroid   . Hypertension    had been on lisinopril prior to pregnacy 2013-was d/c and has not been restarted on anything  . Kidney stone   . Lumbar disc disease   . Obesity (BMI 30-39.9)   . Paroxysmal atrial fibrillation ([facility]) 11/29/2016   CHA2DS2VASC score 2    onset 11/29/2016    Family History  Problem Relation Age of Onset  . Hypertension Mother   . Heart disease Mother   . Arthritis Mother   . Hyperlipidemia Mother   . Hypertension Maternal Grandmother   . Heart disease Maternal Grandmother   . Arthritis Maternal Grandmother   . Hypertension Maternal Grandfather   . Heart disease Maternal Grandfather   . Anesthesia problems Neg Hx   . Other Neg Hx     Past Surgical History:  Procedure Laterality Date  . CERVICAL LAMINECTOMY  01/2010   C6 metal bracing   . CESAREAN SECTION  10/10/2012   Procedure: CESAREAN SECTION;  Surgeon: Frederico Hamman, MD;  Location: [facility];  Service: Obstetrics;  Laterality: N/A;  . DILITATION & CURRETTAGE/HYSTROSCOPY WITH HYDROTHERMAL ABLATION N/A 10/16/2015   Procedure: DILATATION & CURETTAGE/HYSTEROSCOPY WITH HYDROTHERMAL ABLATION;  Surgeon: Frederico Hamman, MD;  Location: [facility];  Service: Gynecology;  Laterality: N/A;  . LAPAROSCOPIC TUBAL LIGATION   01/18/2013   Procedure: LAPAROSCOPIC TUBAL LIGATION;  Surgeon: Frederico Hamman, MD;  Location: [facility];  Service: Gynecology;  Laterality: Bilateral;  . TUBAL LIGATION     Social History   Occupational History  . Not on file.   Social History Main Topics  . Smoking status: Current Every Day Smoker    Packs/day: 1.00    Years: 20.00    Types: Cigarettes    Last attempt to quit: 01/28/2012  . Smokeless tobacco: Never Used  . Alcohol use No  . Drug use: No  . Sexual activity: Yes    Birth control/ protection: Surgical     Comment: tubal at 6 weeks

## 2017-06-17 ENCOUNTER — Telehealth (INDEPENDENT_AMBULATORY_CARE_PROVIDER_SITE_OTHER): Payer: Self-pay | Admitting: Orthopedic Surgery

## 2017-06-17 NOTE — Telephone Encounter (Signed)
Ms. Tonkinson called to advise Dr. Lorin Mercy that she spoke with her Cardiologist, Dr. Domingo Cocking, and he cleared her for lumbar spine surgery.  She also let me know that she now has Medicaid. She is ready to schedule.  I advised  that I would need a letter from Dr. Domingo Cocking, he is with [facility] and his notes will not show up in EPIC. Can you please fill out a surgery sheet for her and I will obtain the clearance and schedule.

## 2017-06-21 NOTE — Telephone Encounter (Signed)
Ok , blue sheet done. Do not need cell saver for one level . Thanks

## 2017-06-25 NOTE — Telephone Encounter (Signed)
I called and left a voicemail for Darlene Black to call me back to discuss scheduling her procedure.

## 2017-07-05 ENCOUNTER — Other Ambulatory Visit (INDEPENDENT_AMBULATORY_CARE_PROVIDER_SITE_OTHER): Payer: Self-pay | Admitting: Orthopaedic Surgery

## 2017-07-05 DIAGNOSIS — M48061 Spinal stenosis, lumbar region without neurogenic claudication: Secondary | ICD-10-CM

## 2017-07-06 ENCOUNTER — Encounter (INDEPENDENT_AMBULATORY_CARE_PROVIDER_SITE_OTHER): Payer: Self-pay | Admitting: Orthopaedic Surgery

## 2017-07-06 ENCOUNTER — Ambulatory Visit (INDEPENDENT_AMBULATORY_CARE_PROVIDER_SITE_OTHER): Payer: Medicaid Other | Admitting: Orthopaedic Surgery

## 2017-07-06 VITALS — BP 154/96 | HR 63 | Ht 63.0 in | Wt 237.0 lb

## 2017-07-06 DIAGNOSIS — M519 Unspecified thoracic, thoracolumbar and lumbosacral intervertebral disc disorder: Secondary | ICD-10-CM | POA: Diagnosis not present

## 2017-07-06 MED ORDER — CYCLOBENZAPRINE HCL 10 MG PO TABS: 10.0000 mg | ORAL_TABLET | Freq: Every day | ORAL | 0 refills | Status: DC

## 2017-07-06 NOTE — Progress Notes (Signed)
Office Visit Note   Patient: Darlene Black           Date of Birth: 10-01-1976           MRN: [id number] Visit Date: 07/06/2017              Requested by: Vicenta Aly, [address] [place], [place] [address] PCP: Vicenta Aly, FNP   Assessment & Plan: Visit Diagnoses:  1. Lumbar disc disease     Plan: We discussed the the outlined plan and avoid taking narcotic medicine before the surgery that after the surgery the pain medication will work better. We discussed operative plan instrumentation risks of surgery. Stopping her L requests preoperatively and then she'll be resuming the few days after the surgery. Risk of problems with CVA due to her history of atrial fib in the immediate the pre-and short-term postop.. Risks of bleeding reoperation pseudoarthrosis problems with the screw positioning dural tear discussed in detail.  Follow-Up Instructions: No Follow-up on file.   Orders:  No orders of the defined types were placed in this encounter.  Meds ordered this encounter  Medications  . cyclobenzaprine (FLEXERIL) 10 MG tablet    Sig: Take 1 tablet (10 mg total) by mouth at bedtime.    Dispense:  30 tablet    Refill:  0      Procedures: No procedures performed   Clinical Data: No additional findings.   Subjective: Chief Complaint  Patient presents with  . Lower Back - Pain, Follow-up    HPI patient returns she's been scheduled for surgery August 6. She requested something to help her with the pain that she is having on a daily basis she's used Tylenol. She's had the tramadol. Chest trouble at night sleeping she's gotten some relief with Flexeril the past and new prescription given.  Review of Systems review of systems 14 point is updated and is unchanged.   Objective: Vital Signs: BP (!) 154/96   Pulse 63   Ht 5\' 3"  (1.6 m)   Wt 237 lb (107.5 kg)   BMI 41.98 kg/m   Physical Exam  Ortho Exam  Specialty Comments:  No specialty  comments available.  Imaging: No results found.   PMFS History: Patient Active Problem List   Diagnosis Date Noted  . Paroxysmal atrial fibrillation ([facility]) 11/29/2016  . Hypertension 11/29/2016  . Current use of long term anticoagulation 11/29/2016  . Atrial fibrillation with rapid ventricular response ([facility]) 11/29/2016  . Obesity (BMI 30-39.9)   . Lumbar disc disease    Past Medical History:  Diagnosis Date  . Fibroid   . Hypertension    had been on lisinopril prior to pregnacy 2013-was d/c and has not been restarted on anything  . Kidney stone   . Lumbar disc disease   . Obesity (BMI 30-39.9)   . Paroxysmal atrial fibrillation ([facility]) 11/29/2016   CHA2DS2VASC score 2    onset 11/29/2016    Family History  Problem Relation Age of Onset  . Hypertension Mother   . Heart disease Mother   . Arthritis Mother   . Hyperlipidemia Mother   . Hypertension Maternal Grandmother   . Heart disease Maternal Grandmother   . Arthritis Maternal Grandmother   . Hypertension Maternal Grandfather   . Heart disease Maternal Grandfather   . Anesthesia problems Neg Hx   . Other Neg Hx     Past Surgical History:  Procedure Laterality Date  . CERVICAL LAMINECTOMY  01/2010  C6 metal bracing   . CESAREAN SECTION  10/10/2012   Procedure: CESAREAN SECTION;  Surgeon: Frederico Hamman, MD;  Location: [facility];  Service: Obstetrics;  Laterality: N/A;  . DILITATION & CURRETTAGE/HYSTROSCOPY WITH HYDROTHERMAL ABLATION N/A 10/16/2015   Procedure: DILATATION & CURETTAGE/HYSTEROSCOPY WITH HYDROTHERMAL ABLATION;  Surgeon: Frederico Hamman, MD;  Location: [facility];  Service: Gynecology;  Laterality: N/A;  . LAPAROSCOPIC TUBAL LIGATION  01/18/2013   Procedure: LAPAROSCOPIC TUBAL LIGATION;  Surgeon: Frederico Hamman, MD;  Location: [facility];  Service: Gynecology;  Laterality: Bilateral;  . TUBAL LIGATION     Social History   Occupational History  . Not on file.   Social History Main Topics  . Smoking  status: Current Every Day Smoker    Packs/day: 1.00    Years: 20.00    Types: Cigarettes    Last attempt to quit: 01/28/2012  . Smokeless tobacco: Never Used  . Alcohol use No  . Drug use: No  . Sexual activity: Yes    Birth control/ protection: Surgical     Comment: tubal at 6 weeks

## 2017-07-09 NOTE — Pre-Procedure Instructions (Signed)
Darlene Black  07/09/2017      [place], [place] - [address] [place] [address] Phone: [phone] Fax: [facility] [id number] [place] [address] [place] [address] Phone: [phone] Fax: [phone]    Your procedure is scheduled on Aug. 6  Report to [facility] at 1030 A.M.  Call this number if you have problems the morning of surgery:  [phone]   Remember:  Do not eat food or drink liquids after midnight.  Take these medicines the morning of surgery with A SIP OF WATER Amlodipine (norvasc), Metoprolol (Lopressor), Tramadol (Ultram) if needed  Stop taking Eliquis as directed by your Dr.   Stop taking aspirin, BC's, Goody's, herbal medications, Fish Oil, Aleve, Ibuprofen, Advil, Motrin   Do not wear jewelry, make-up or nail polish.  Do not wear lotions, powders, or perfumes, or deoderant.  Do not shave 48 hours prior to surgery.  Men may shave face and neck.  Do not bring valuables to the hospital.  [facility] is not responsible for any belongings or valuables.  Contacts, dentures or bridgework may not be worn into surgery.  Leave your suitcase in the car.  After surgery it may be brought to your room.  For patients admitted to the hospital, discharge time will be determined by your treatment team.  Patients discharged the day of surgery will not be allowed to drive home.    Special instructions:   - Preparing for Surgery  Before surgery, you can play an important role.  Because skin is not sterile, your skin needs to be as free of germs as possible.  You can reduce the number of germs on you skin by washing with CHG (chlorahexidine gluconate) soap before surgery.  CHG is an antiseptic cleaner which kills germs and bonds with the skin to continue killing germs even after washing.  Please DO NOT use if  you have an allergy to CHG or antibacterial soaps.  If your skin becomes reddened/irritated stop using the CHG and inform your nurse when you arrive at Short Stay.  Do not shave (including legs and underarms) for at least 48 hours prior to the first CHG shower.  You may shave your face.  Please follow these instructions carefully:   1.  Shower with CHG Soap the night before surgery and the                                morning of Surgery.  2.  If you choose to wash your hair, wash your hair first as usual with your       normal shampoo.  3.  After you shampoo, rinse your hair and body thoroughly to remove the                      Shampoo.  4.  Use CHG as you would any other liquid soap.  You can apply chg directly       to the skin and wash gently with scrungie or a clean washcloth.  5.  Apply the CHG Soap to your body ONLY FROM THE NECK DOWN.        Do not use on open wounds or open sores.  Avoid contact with your eyes,       ears,  mouth and genitals (private parts).  Wash genitals (private parts)       with your normal soap.  6.  Wash thoroughly, paying special attention to the area where your surgery        will be performed.  7.  Thoroughly rinse your body with warm water from the neck down.  8.  DO NOT shower/wash with your normal soap after using and rinsing off       the CHG Soap.  9.  Pat yourself dry with a clean towel.            10.  Wear clean pajamas.            11.  Place clean sheets on your bed the night of your first shower and do not        sleep with pets.  Day of Surgery  Do not apply any lotions/deoderants the morning of surgery.  Please wear clean clothes to the hospital/surgery center.     Please read over the following fact sheets that you were given. Pain Booklet, MRSA Information and Surgical Site Infection Prevention

## 2017-07-12 ENCOUNTER — Encounter (HOSPITAL_COMMUNITY)
Admission: RE | Admit: 2017-07-12 | Discharge: 2017-07-12 | Disposition: A | Discharge status: Home / Self Care | Payer: Medicaid Other | Source: Ambulatory Visit | Attending: Orthopaedic Surgery | Admitting: Orthopaedic Surgery

## 2017-07-12 ENCOUNTER — Encounter (HOSPITAL_COMMUNITY): Payer: Self-pay

## 2017-07-12 DIAGNOSIS — Z01812 Encounter for preprocedural laboratory examination: Secondary | ICD-10-CM | POA: Diagnosis not present

## 2017-07-12 DIAGNOSIS — M4807 Spinal stenosis, lumbosacral region: Secondary | ICD-10-CM | POA: Insufficient documentation

## 2017-07-12 LAB — SURGICAL PCR SCREEN
MRSA, PCR: NEGATIVE
STAPHYLOCOCCUS AUREUS: NEGATIVE

## 2017-07-12 LAB — URINALYSIS, ROUTINE W REFLEX MICROSCOPIC
Bilirubin Urine: NEGATIVE
Glucose, UA: NEGATIVE mg/dL
Hgb urine dipstick: NEGATIVE
KETONES UR: NEGATIVE mg/dL
LEUKOCYTES UA: NEGATIVE
NITRITE: NEGATIVE
PROTEIN: NEGATIVE mg/dL
Specific Gravity, Urine: 1.021 (ref 1.005–1.030)
pH: 6 (ref 5.0–8.0)

## 2017-07-12 LAB — COMPREHENSIVE METABOLIC PANEL
ALK PHOS: 62 U/L (ref 38–126)
ALT: 19 U/L (ref 14–54)
ANION GAP: 4 — AB (ref 5–15)
AST: 21 U/L (ref 15–41)
Albumin: 3.3 g/dL — ABNORMAL LOW (ref 3.5–5.0)
BILIRUBIN TOTAL: 0.5 mg/dL (ref 0.3–1.2)
BUN: 10 mg/dL (ref 6–20)
CALCIUM: 8.9 mg/dL (ref 8.9–10.3)
CO2: 27 mmol/L (ref 22–32)
Chloride: 106 mmol/L (ref 101–111)
Creatinine, Ser: 0.78 mg/dL (ref 0.44–1.00)
GFR calc non Af Amer: >60 mL/min (ref >60)
Glucose, Bld: 91 mg/dL (ref 65–99)
POTASSIUM: 4 mmol/L (ref 3.5–5.1)
SODIUM: 137 mmol/L (ref 135–145)
TOTAL PROTEIN: 7.5 g/dL (ref 6.5–8.1)

## 2017-07-12 LAB — HCG, SERUM, QUALITATIVE: Preg, Serum: NEGATIVE

## 2017-07-19 ENCOUNTER — Inpatient Hospital Stay (HOSPITAL_COMMUNITY): Admission: RE | Admit: 2017-07-19 | Payer: Medicaid Other | Source: Ambulatory Visit | Admitting: Orthopaedic Surgery

## 2017-07-19 ENCOUNTER — Encounter (HOSPITAL_COMMUNITY): Admission: RE | Payer: Self-pay | Source: Ambulatory Visit

## 2017-07-19 SURGERY — POSTERIOR LUMBAR FUSION 1 LEVEL
Anesthesia: General | Laterality: Left

## 2017-07-19 MED ORDER — ROCURONIUM BROMIDE 10 MG/ML (PF) SYRINGE
PREFILLED_SYRINGE | INTRAVENOUS | Status: AC
  Filled 2017-07-19: qty 5

## 2017-07-19 MED ORDER — FENTANYL CITRATE (PF) 250 MCG/5ML IJ SOLN
INTRAMUSCULAR | Status: AC
Start: 2017-07-19 — End: 2017-07-19
  Filled 2017-07-19: qty 5

## 2017-07-19 MED ORDER — LIDOCAINE 2% (20 MG/ML) 5 ML SYRINGE
INTRAMUSCULAR | Status: AC
  Filled 2017-07-19: qty 5

## 2017-07-19 MED ORDER — PROPOFOL 10 MG/ML IV BOLUS
INTRAVENOUS | Status: AC
  Filled 2017-07-19: qty 20

## 2017-07-19 MED ORDER — MIDAZOLAM HCL 2 MG/2ML IJ SOLN
INTRAMUSCULAR | Status: AC
  Filled 2017-07-19: qty 2

## 2017-07-27 ENCOUNTER — Encounter (INDEPENDENT_AMBULATORY_CARE_PROVIDER_SITE_OTHER): Payer: Self-pay | Admitting: Orthopaedic Surgery

## 2017-07-27 ENCOUNTER — Ambulatory Visit (INDEPENDENT_AMBULATORY_CARE_PROVIDER_SITE_OTHER): Payer: Medicaid Other | Admitting: Orthopaedic Surgery

## 2017-07-27 VITALS — Ht 63.0 in | Wt 237.0 lb

## 2017-07-27 DIAGNOSIS — M9983 Other biomechanical lesions of lumbar region: Secondary | ICD-10-CM

## 2017-07-27 DIAGNOSIS — M48061 Spinal stenosis, lumbar region without neurogenic claudication: Secondary | ICD-10-CM

## 2017-07-27 NOTE — Progress Notes (Signed)
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 Valgus will be stopped preop restarted several days after surgery. Wrist surgery discussed questions solicited and answered. Postop use of a brace and walking program discussed. Her goal is to resume work activity once her fusion is solid.  Follow-Up Instructions: No Follow-up on file.   Orders:  No orders of the defined types were placed in this encounter.  No orders of the defined types were placed in this encounter.     Procedures: No procedures performed   Clinical Data: No additional findings.   Subjective: Chief Complaint  Patient presents with  . Lower Back - Pain    HPI patient returns with ongoing L5-S1 degeneration with retrolisthesis, acquired foraminal stenosis and congenital stenosis. Patient has not been able to work due to her back symptoms. She was scheduled for surgery at one point in [place]. She has atrial fibrillation and is on long-term anticoagulation at this point. She has quit smoking in anticipation of surgery. Patient's been through therapy, epidural injections 3, home exercise program anti-inflammatories, prednisone Dosepak without relief. Patient has pain with standing after 10 minutes not able sit long as continue change positions. Cc grandmother all for her pain and rates her pain a moderate to severe. Patient has 60% loss of disc space height with retrolisthesis of L5-S1 with by foraminal stenosis by MRI scan and congenital short pedicles. She denies fever chills. No  bowel or bowel or symptoms associated.  Review of Systems positive for greater than 2 years chronic back pain. Intermittent atrial fibrillation currently on Eliquis. Previous GYN surgery. Patient's been cleared by her cardiologist. Past history of kidney stones, hypertension, uterine fibroids.   Objective: Vital Signs: Ht 5\' 3"  (1.6 m)   Wt 237 lb (107.5 kg)   LMP 07/05/2017   BMI 41.98 kg/m   Physical Exam  Constitutional: She is oriented to person, place, and time. She appears well-developed.  HENT:  Head: Normocephalic.  Right Ear: External ear normal.  Left Ear: External ear normal.  Eyes: Pupils are equal, round, and reactive to light.  Neck: No tracheal deviation present. No thyromegaly present.  Cardiovascular: Normal rate.   Pulmonary/Chest: Effort normal.  Abdominal: Soft.  Neurological: She is alert and oriented to person, place, and time.  Skin: Skin is warm and dry.  Psychiatric: She has a normal mood and affect. Her behavior is normal.    Ortho Exam patient is right left sciatic notch tenderness. Reflexes are 2+ and symmetrical. Positive straight leg raising on the right and left the radiates down the lateral aspect of her foot. Normal hip range of motion SI joint tests are negative. Anterior tib EHL gastrocsoleus is strong. Knees reach full extension minimal crepitus. Good quad and hip flexor strength.  Specialty Comments:  No specialty comments available.  Imaging:Lumbar MRI 02/05/2017 shows disc degeneration at L5-S1 with disc space collapse, moderate bilateral neural foraminal stenosis with congenital short pedicles. Acquired foraminal  stenosis with congenital short pedicles. She has mild spondylosis at all other lumbar levels.  PMFS History: Patient Active Problem List   Diagnosis Date Noted  . Paroxysmal atrial fibrillation ([facility]) 11/29/2016  . Hypertension 11/29/2016  . Current use of long term anticoagulation 11/29/2016  . Atrial fibrillation with rapid  ventricular response ([facility]) 11/29/2016  . Obesity (BMI 30-39.9)   . Lumbar disc disease    Past Medical History:  Diagnosis Date  . Fibroid   . Hypertension    had been on lisinopril prior to pregnacy 2013-was d/c and has not been restarted on anything  . Kidney stone   . Lumbar disc disease   . Obesity (BMI 30-39.9)   . Paroxysmal atrial fibrillation ([facility]) 11/29/2016   CHA2DS2VASC score 2    onset 11/29/2016    Family History  Problem Relation Age of Onset  . Hypertension Mother   . Heart disease Mother   . Arthritis Mother   . Hyperlipidemia Mother   . Hypertension Maternal Grandmother   . Heart disease Maternal Grandmother   . Arthritis Maternal Grandmother   . Hypertension Maternal Grandfather   . Heart disease Maternal Grandfather   . Anesthesia problems Neg Hx   . Other Neg Hx     Past Surgical History:  Procedure Laterality Date  . CERVICAL LAMINECTOMY  01/2010   C6 metal bracing   . CESAREAN SECTION  10/10/2012   Procedure: CESAREAN SECTION;  Surgeon: Frederico Hamman, MD;  Location: [facility];  Service: Obstetrics;  Laterality: N/A;  . DILITATION & CURRETTAGE/HYSTROSCOPY WITH HYDROTHERMAL ABLATION N/A 10/16/2015   Procedure: DILATATION & CURETTAGE/HYSTEROSCOPY WITH HYDROTHERMAL ABLATION;  Surgeon: Frederico Hamman, MD;  Location: [facility];  Service: Gynecology;  Laterality: N/A;  . LAPAROSCOPIC TUBAL LIGATION  01/18/2013   Procedure: LAPAROSCOPIC TUBAL LIGATION;  Surgeon: Frederico Hamman, MD;  Location: [facility];  Service: Gynecology;  Laterality: Bilateral;  . TUBAL LIGATION     Social History   Occupational History  . Not on file.   Social History Main Topics  . Smoking status: Former Smoker    Packs/day: 1.00    Years: 20.00    Types: Cigarettes    Quit date: 12/28/2011  . Smokeless tobacco: Never Used  . Alcohol use No  . Drug use: No  . Sexual activity: Yes    Birth control/ protection: Surgical     Comment: tubal at 6 weeks

## 2017-08-24 ENCOUNTER — Other Ambulatory Visit: Payer: Self-pay | Admitting: Physician Assistant

## 2017-08-24 DIAGNOSIS — Z1231 Encounter for screening mammogram for malignant neoplasm of breast: Secondary | ICD-10-CM

## 2017-08-25 ENCOUNTER — Ambulatory Visit (INDEPENDENT_AMBULATORY_CARE_PROVIDER_SITE_OTHER): Payer: Medicaid Other | Admitting: Orthopaedic Surgery

## 2017-08-31 ENCOUNTER — Other Ambulatory Visit (INDEPENDENT_AMBULATORY_CARE_PROVIDER_SITE_OTHER): Payer: Self-pay | Admitting: Family

## 2017-09-07 ENCOUNTER — Ambulatory Visit
Admission: RE | Admit: 2017-09-07 | Discharge: 2017-09-07 | Disposition: A | Discharge status: Home / Self Care | Payer: Medicaid Other | Source: Ambulatory Visit | Attending: Physician Assistant | Admitting: Physician Assistant

## 2017-09-07 DIAGNOSIS — Z1231 Encounter for screening mammogram for malignant neoplasm of breast: Secondary | ICD-10-CM

## 2017-09-07 NOTE — Pre-Procedure Instructions (Addendum)
[place]  09/07/2017      [place], [place] - [address] [place] [address] Phone: [phone] Fax: [place] # [address] [place] [address] [place] Sle[place] [address] Phone: [phone] Fax: [phone]    Your procedure is scheduled on 09/15/2017.  Report to [facility] Admitting at 1030 A.M.  Call this number if you have problems the morning of surgery:  [phone]   Remember:  Do not eat food or drink liquids after midnight.  Continue all other medications as directed by your physician except follow these medication instructions before surgery   Take these medicines the morning of surgery with A SIP OF WATER: Amlodipine (Norvasc) Metoprolol (Lopressor)  7 days prior to surgery STOP taking any Aspirin, Aleve, Naproxen, Ibuprofen, Motrin, Advil, Goody's, BC's, all herbal medications, fish oil, and all vitamins  Follow your doctors instructions regarding your Eliquis.  If no instructions were given by the doctor you will need to call the office to get instructions.      Do not wear jewelry, make-up or nail polish.  Do not wear lotions, powders, or perfumes, or deodorant.  Do not shave 48 hours prior to surgery.  Do not bring valuables to the hospital.  [facility] is not responsible for any belongings or valuables.  Contacts, dentures or bridgework may not be worn into surgery.  Leave your suitcase in the car.  After surgery it may be brought to your room.  For patients admitted to the hospital, discharge time will be determined by your treatment team.  Patients discharged the day of surgery will not be allowed to drive home.   Name and phone number of your driver:    Special instructions:   [facility]- Preparing For Surgery  Before surgery, you can play an important role. Because skin is not sterile, your skin needs to be  as free of germs as possible. You can reduce the number of germs on your skin by washing with CHG (chlorahexidine gluconate) Soap before surgery.  CHG is an antiseptic cleaner which kills germs and bonds with the skin to continue killing germs even after washing.  Please do not use if you have an allergy to CHG or antibacterial soaps. If your skin becomes reddened/irritated stop using the CHG.  Do not shave (including legs and underarms) for at least 48 hours prior to first CHG shower. It is OK to shave your face.  Please follow these instructions carefully.   1. Shower the NIGHT BEFORE SURGERY and the MORNING OF SURGERY with CHG.   2. If you chose to wash your hair, wash your hair first as usual with your normal shampoo.  3. After you shampoo, rinse your hair and body thoroughly to remove the shampoo.  4. Use CHG as you would any other liquid soap. You can apply CHG directly to the skin and wash gently with a scrungie or a clean washcloth.   5. Apply the CHG Soap to your body ONLY FROM THE NECK DOWN.  Do not use on open wounds or open sores. Avoid contact with your eyes, ears, mouth and genitals (private parts). Wash genitals (private parts) with your normal soap.  6. Wash thoroughly, paying special attention to the area where your surgery will be performed.  7. Thoroughly rinse your body with warm water from the neck down.  8. DO NOT shower/wash  with your normal soap after using and rinsing off the CHG Soap.  9. Pat yourself dry with a CLEAN TOWEL.   10. Wear CLEAN PAJAMAS   11. Place CLEAN SHEETS on your bed the night of your first shower and DO NOT SLEEP WITH PETS.    Day of Surgery: Shower as stated above. Do not apply any deodorants/lotions.  Please wear clean clothes to the hospital/surgery center.      Please read over the following fact sheets that you were given. Pain Booklet, Coughing and Deep Breathing, MRSA Information and Surgical Site Infection Prevention and  Incentive Spirometry

## 2017-09-08 ENCOUNTER — Other Ambulatory Visit: Payer: Self-pay | Admitting: Physician Assistant

## 2017-09-08 ENCOUNTER — Encounter (HOSPITAL_COMMUNITY)
Admission: RE | Admit: 2017-09-08 | Discharge: 2017-09-08 | Disposition: A | Discharge status: Home / Self Care | Payer: Medicaid Other | Source: Ambulatory Visit | Attending: Orthopaedic Surgery | Admitting: Orthopaedic Surgery

## 2017-09-08 ENCOUNTER — Encounter (HOSPITAL_COMMUNITY): Payer: Self-pay

## 2017-09-08 DIAGNOSIS — M4807 Spinal stenosis, lumbosacral region: Secondary | ICD-10-CM | POA: Diagnosis not present

## 2017-09-08 DIAGNOSIS — Z01812 Encounter for preprocedural laboratory examination: Secondary | ICD-10-CM | POA: Insufficient documentation

## 2017-09-08 DIAGNOSIS — R928 Other abnormal and inconclusive findings on diagnostic imaging of breast: Secondary | ICD-10-CM

## 2017-09-08 HISTORY — DX: Unspecified convulsions: R56.9

## 2017-09-08 HISTORY — DX: Unspecified osteoarthritis, unspecified site: M19.90

## 2017-09-08 HISTORY — DX: Personal history of urinary calculi: Z87.442

## 2017-09-08 LAB — TYPE AND SCREEN
ABO/RH(D): B POS
ANTIBODY SCREEN: NEGATIVE

## 2017-09-08 LAB — BASIC METABOLIC PANEL
ANION GAP: 4 — AB (ref 5–15)
BUN: 7 mg/dL (ref 6–20)
CHLORIDE: 104 mmol/L (ref 101–111)
CO2: 28 mmol/L (ref 22–32)
Calcium: 9 mg/dL (ref 8.9–10.3)
Creatinine, Ser: 0.86 mg/dL (ref 0.44–1.00)
GFR calc non Af Amer: >60 mL/min (ref >60)
Glucose, Bld: 84 mg/dL (ref 65–99)
POTASSIUM: 3.9 mmol/L (ref 3.5–5.1)
Sodium: 136 mmol/L (ref 135–145)

## 2017-09-08 LAB — CBC
HEMATOCRIT: 34.9 % — AB (ref 36.0–46.0)
HEMOGLOBIN: 11.9 g/dL — AB (ref 12.0–15.0)
MCH: 28.3 pg (ref 26.0–34.0)
MCHC: 34.1 g/dL (ref 30.0–36.0)
MCV: 83.1 fL (ref 78.0–100.0)
PLATELETS: 293 10*3/uL (ref 150–400)
RBC: 4.2 MIL/uL (ref 3.87–5.11)
RDW: 13.2 % (ref 11.5–15.5)
WBC: 3.8 10*3/uL — AB (ref 4.0–10.5)

## 2017-09-08 LAB — SURGICAL PCR SCREEN
MRSA, PCR: NEGATIVE
Staphylococcus aureus: NEGATIVE

## 2017-09-08 LAB — HCG, SERUM, QUALITATIVE: Preg, Serum: NEGATIVE

## 2017-09-08 NOTE — Progress Notes (Signed)
Requesting records from [facility] for last ov note & ekg via fax.

## 2017-09-09 NOTE — Progress Notes (Signed)
Anesthesia Chart Review:  Pt is a 41 year old female scheduled for left L5-S1 transforaminal lumbar interbody fusion, pedicle instrumentation on 09/15/2017 with Rodell Perna, MD  - PCP is Vicenta Aly, NP - Cardiologist is Cherlynn Polo, MD who cleared pt for surgery at last office visit 06/08/17 (notes in care everywhere)   PMH includes:  HTN, PAF,.  Former smoker. BMI 41.5  Medications include: Amlodipine, Eliquis, lisinopril-HCTZ, metoprolol. Last dose Eliquis 09/03/17.  BP 138/90   Pulse 70   Temp 37 C   Resp 20   Ht 5\' 3"  (1.6 m)   Wt 234 lb 14.4 oz (106.5 kg)   LMP 08/29/2017   SpO2 95%   BMI 41.61 kg/m   Preoperative labs reviewed.    CXR 11/29/16: No active cardiopulmonary disease.  EKG 11/29/16: sinus bradycardia (50 bpm)  Echo 11/29/16:  - Left ventricle: The cavity size was normal. Wall thickness was increased in a pattern of moderate LVH. Systolic function was vigorous. The estimated ejection fraction was in the range of 65% to 70%. Wall motion was normal; there were no regional wall motion abnormalities. - Left atrium: The atrium was moderately dilated.  If no changes, I anticipate pt can proceed with surgery as scheduled.   Willeen Cass, FNP-BC [facility] Short Stay Surgical Center/Anesthesiology Phone: [phone] 09/09/2017 1:25 PM

## 2017-09-15 ENCOUNTER — Inpatient Hospital Stay (HOSPITAL_COMMUNITY): Payer: Medicaid Other | Admitting: Anesthesiology

## 2017-09-15 ENCOUNTER — Inpatient Hospital Stay (HOSPITAL_COMMUNITY): Payer: Medicaid Other | Admitting: Emergency Medicine

## 2017-09-15 ENCOUNTER — Inpatient Hospital Stay (HOSPITAL_COMMUNITY)
Admission: RE | Admit: 2017-09-15 | Discharge: 2017-09-16 | DRG: 460 | Disposition: A | Discharge status: Home / Self Care | Payer: Medicaid Other | Source: Ambulatory Visit | Attending: Orthopaedic Surgery | Admitting: Orthopaedic Surgery

## 2017-09-15 ENCOUNTER — Encounter (HOSPITAL_COMMUNITY): Payer: Self-pay

## 2017-09-15 ENCOUNTER — Inpatient Hospital Stay (HOSPITAL_COMMUNITY): Payer: Medicaid Other

## 2017-09-15 ENCOUNTER — Encounter (HOSPITAL_COMMUNITY)
Admission: RE | Disposition: A | Discharge status: Home / Self Care | Payer: Self-pay | Source: Ambulatory Visit | Attending: Orthopaedic Surgery

## 2017-09-15 DIAGNOSIS — M5127 Other intervertebral disc displacement, lumbosacral region: Secondary | ICD-10-CM | POA: Diagnosis present

## 2017-09-15 DIAGNOSIS — Z23 Encounter for immunization: Secondary | ICD-10-CM

## 2017-09-15 DIAGNOSIS — M4807 Spinal stenosis, lumbosacral region: Secondary | ICD-10-CM | POA: Diagnosis present

## 2017-09-15 DIAGNOSIS — Z6830 Body mass index (BMI) 30.0-30.9, adult: Secondary | ICD-10-CM

## 2017-09-15 DIAGNOSIS — I480 Paroxysmal atrial fibrillation: Secondary | ICD-10-CM | POA: Diagnosis present

## 2017-09-15 DIAGNOSIS — Z91011 Allergy to milk products: Secondary | ICD-10-CM

## 2017-09-15 DIAGNOSIS — Z7901 Long term (current) use of anticoagulants: Secondary | ICD-10-CM | POA: Diagnosis not present

## 2017-09-15 DIAGNOSIS — Z87891 Personal history of nicotine dependence: Secondary | ICD-10-CM | POA: Diagnosis not present

## 2017-09-15 DIAGNOSIS — I10 Essential (primary) hypertension: Secondary | ICD-10-CM | POA: Diagnosis present

## 2017-09-15 DIAGNOSIS — Z79899 Other long term (current) drug therapy: Secondary | ICD-10-CM

## 2017-09-15 DIAGNOSIS — E669 Obesity, unspecified: Secondary | ICD-10-CM | POA: Diagnosis present

## 2017-09-15 DIAGNOSIS — Z8249 Family history of ischemic heart disease and other diseases of the circulatory system: Secondary | ICD-10-CM | POA: Diagnosis not present

## 2017-09-15 DIAGNOSIS — M48062 Spinal stenosis, lumbar region with neurogenic claudication: Secondary | ICD-10-CM

## 2017-09-15 DIAGNOSIS — M48061 Spinal stenosis, lumbar region without neurogenic claudication: Secondary | ICD-10-CM | POA: Diagnosis present

## 2017-09-15 DIAGNOSIS — Z419 Encounter for procedure for purposes other than remedying health state, unspecified: Secondary | ICD-10-CM

## 2017-09-15 SURGERY — POSTERIOR LUMBAR FUSION 1 LEVEL
Anesthesia: General | Site: Spine Lumbar | Laterality: Left

## 2017-09-15 MED ORDER — SURGIFOAM 100 EX MISC
CUTANEOUS | Status: DC | PRN
  Administered 2017-09-15: 14:00:00 via TOPICAL

## 2017-09-15 MED ORDER — SODIUM CHLORIDE 0.9 % IV SOLN: 250.0000 mL | INTRAVENOUS | Status: DC

## 2017-09-15 MED ORDER — CEFAZOLIN SODIUM-DEXTROSE 2-4 GM/100ML-% IV SOLN
2.0000 g | Freq: Three times a day (TID) | INTRAVENOUS | Status: AC
  Administered 2017-09-15 – 2017-09-16 (×2): 2 g via INTRAVENOUS
  Filled 2017-09-15 (×2): qty 100

## 2017-09-15 MED ORDER — HYDROMORPHONE HCL 1 MG/ML IJ SOLN
INTRAMUSCULAR | Status: AC
  Filled 2017-09-15: qty 0.5

## 2017-09-15 MED ORDER — PHENYLEPHRINE HCL 10 MG/ML IJ SOLN
INTRAMUSCULAR | Status: DC | PRN
  Administered 2017-09-15: 80 ug via INTRAVENOUS
  Administered 2017-09-15: 40 ug via INTRAVENOUS
  Administered 2017-09-15 (×3): 80 ug via INTRAVENOUS

## 2017-09-15 MED ORDER — PHENYLEPHRINE HCL 10 MG/ML IJ SOLN
INTRAVENOUS | Status: DC | PRN
  Administered 2017-09-15: 50 ug/min via INTRAVENOUS
  Administered 2017-09-15: 25 ug/min via INTRAVENOUS

## 2017-09-15 MED ORDER — BUPIVACAINE HCL (PF) 0.5 % IJ SOLN
INTRAMUSCULAR | Status: DC | PRN
  Administered 2017-09-15: 10 mL

## 2017-09-15 MED ORDER — SUGAMMADEX SODIUM 200 MG/2ML IV SOLN
INTRAVENOUS | Status: DC | PRN
  Administered 2017-09-15: 200 mg via INTRAVENOUS

## 2017-09-15 MED ORDER — MIDAZOLAM HCL 5 MG/5ML IJ SOLN
INTRAMUSCULAR | Status: DC | PRN
  Administered 2017-09-15: 2 mg via INTRAVENOUS

## 2017-09-15 MED ORDER — CEFAZOLIN SODIUM-DEXTROSE 2-4 GM/100ML-% IV SOLN
2.0000 g | INTRAVENOUS | Status: AC
  Administered 2017-09-15: 2 g via INTRAVENOUS
  Filled 2017-09-15: qty 100

## 2017-09-15 MED ORDER — SODIUM CHLORIDE 0.9% FLUSH: 3.0000 mL | Freq: Two times a day (BID) | INTRAVENOUS | Status: DC

## 2017-09-15 MED ORDER — SODIUM CHLORIDE 0.9% FLUSH: 3.0000 mL | INTRAVENOUS | Status: DC | PRN

## 2017-09-15 MED ORDER — ONDANSETRON HCL 4 MG/2ML IJ SOLN: 4.0000 mg | Freq: Once | INTRAMUSCULAR | Status: DC | PRN

## 2017-09-15 MED ORDER — PROPOFOL 10 MG/ML IV BOLUS
INTRAVENOUS | Status: AC
  Filled 2017-09-15: qty 20

## 2017-09-15 MED ORDER — THROMBIN 20000 UNITS EX SOLR
CUTANEOUS | Status: AC
  Filled 2017-09-15: qty 20000

## 2017-09-15 MED ORDER — 0.9 % SODIUM CHLORIDE (POUR BTL) OPTIME
TOPICAL | Status: DC | PRN
  Administered 2017-09-15: 1000 mL

## 2017-09-15 MED ORDER — METHOCARBAMOL 500 MG PO TABS
500.0000 mg | ORAL_TABLET | Freq: Four times a day (QID) | ORAL | Status: DC | PRN
  Administered 2017-09-15 – 2017-09-16 (×3): 500 mg via ORAL
  Filled 2017-09-15 (×3): qty 1

## 2017-09-15 MED ORDER — ALBUMIN HUMAN 5 % IV SOLN
INTRAVENOUS | Status: DC | PRN
  Administered 2017-09-15: 14:00:00 via INTRAVENOUS

## 2017-09-15 MED ORDER — SODIUM CHLORIDE 0.9 % IV SOLN: INTRAVENOUS | Status: DC

## 2017-09-15 MED ORDER — ONDANSETRON HCL 4 MG/2ML IJ SOLN
INTRAMUSCULAR | Status: AC
  Filled 2017-09-15: qty 2

## 2017-09-15 MED ORDER — MENTHOL 3 MG MT LOZG: 1.0000 | LOZENGE | OROMUCOSAL | Status: DC | PRN

## 2017-09-15 MED ORDER — HYDROMORPHONE HCL 1 MG/ML IJ SOLN
0.2500 mg | INTRAMUSCULAR | Status: DC | PRN
  Administered 2017-09-15 (×3): 0.5 mg via INTRAVENOUS

## 2017-09-15 MED ORDER — LISINOPRIL 20 MG PO TABS
20.0000 mg | ORAL_TABLET | Freq: Every day | ORAL | Status: DC
  Administered 2017-09-15 – 2017-09-16 (×2): 20 mg via ORAL
  Filled 2017-09-15 (×2): qty 1

## 2017-09-15 MED ORDER — ONDANSETRON HCL 4 MG/2ML IJ SOLN
INTRAMUSCULAR | Status: DC | PRN
  Administered 2017-09-15: 4 mg via INTRAVENOUS

## 2017-09-15 MED ORDER — DEXAMETHASONE SODIUM PHOSPHATE 10 MG/ML IJ SOLN
INTRAMUSCULAR | Status: AC
  Filled 2017-09-15: qty 2

## 2017-09-15 MED ORDER — HYDROMORPHONE HCL 1 MG/ML IJ SOLN
INTRAMUSCULAR | Status: AC
  Administered 2017-09-15: 0.5 mg via INTRAVENOUS
  Filled 2017-09-15: qty 1

## 2017-09-15 MED ORDER — DEXAMETHASONE SODIUM PHOSPHATE 10 MG/ML IJ SOLN
INTRAMUSCULAR | Status: DC | PRN
  Administered 2017-09-15: 8 mg via INTRAVENOUS

## 2017-09-15 MED ORDER — ROCURONIUM BROMIDE 10 MG/ML (PF) SYRINGE
PREFILLED_SYRINGE | INTRAVENOUS | Status: AC
  Filled 2017-09-15: qty 5

## 2017-09-15 MED ORDER — PROPOFOL 10 MG/ML IV BOLUS
INTRAVENOUS | Status: DC | PRN
  Administered 2017-09-15: 140 mg via INTRAVENOUS

## 2017-09-15 MED ORDER — EPHEDRINE 5 MG/ML INJ
INTRAVENOUS | Status: AC
  Filled 2017-09-15: qty 10

## 2017-09-15 MED ORDER — METOPROLOL TARTRATE 25 MG PO TABS
50.0000 mg | ORAL_TABLET | Freq: Two times a day (BID) | ORAL | Status: DC
  Administered 2017-09-15 – 2017-09-16 (×2): 50 mg via ORAL
  Filled 2017-09-15 (×2): qty 2

## 2017-09-15 MED ORDER — METHOCARBAMOL 1000 MG/10ML IJ SOLN
500.0000 mg | Freq: Four times a day (QID) | INTRAMUSCULAR | Status: DC | PRN
  Filled 2017-09-15: qty 5

## 2017-09-15 MED ORDER — LISINOPRIL-HYDROCHLOROTHIAZIDE 20-25 MG PO TABS: 1.0000 | ORAL_TABLET | Freq: Every day | ORAL | Status: DC

## 2017-09-15 MED ORDER — PHENOL 1.4 % MT LIQD
1.0000 | OROMUCOSAL | Status: DC | PRN
Start: 2017-09-15 — End: 2017-09-16

## 2017-09-15 MED ORDER — POLYETHYLENE GLYCOL 3350 17 G PO PACK: 17.0000 g | PACK | Freq: Every day | ORAL | Status: DC | PRN

## 2017-09-15 MED ORDER — HYDROCHLOROTHIAZIDE 25 MG PO TABS
25.0000 mg | ORAL_TABLET | Freq: Every day | ORAL | Status: DC
  Administered 2017-09-15 – 2017-09-16 (×2): 25 mg via ORAL
  Filled 2017-09-15 (×2): qty 1

## 2017-09-15 MED ORDER — EPHEDRINE SULFATE 50 MG/ML IJ SOLN
INTRAMUSCULAR | Status: DC | PRN
  Administered 2017-09-15: 10 mg via INTRAVENOUS
  Administered 2017-09-15 (×3): 5 mg via INTRAVENOUS

## 2017-09-15 MED ORDER — ROCURONIUM BROMIDE 100 MG/10ML IV SOLN
INTRAVENOUS | Status: DC | PRN
  Administered 2017-09-15: 20 mg via INTRAVENOUS
  Administered 2017-09-15: 10 mg via INTRAVENOUS
  Administered 2017-09-15: 50 mg via INTRAVENOUS

## 2017-09-15 MED ORDER — SUGAMMADEX SODIUM 500 MG/5ML IV SOLN
INTRAVENOUS | Status: AC
  Filled 2017-09-15: qty 5

## 2017-09-15 MED ORDER — CHLORHEXIDINE GLUCONATE 4 % EX LIQD: 60.0000 mL | Freq: Once | CUTANEOUS | Status: DC

## 2017-09-15 MED ORDER — AMLODIPINE BESYLATE 5 MG PO TABS
5.0000 mg | ORAL_TABLET | Freq: Every day | ORAL | Status: DC
  Administered 2017-09-16: 5 mg via ORAL
  Filled 2017-09-15 (×2): qty 1

## 2017-09-15 MED ORDER — ONDANSETRON HCL 4 MG PO TABS: 4.0000 mg | ORAL_TABLET | Freq: Four times a day (QID) | ORAL | Status: DC | PRN

## 2017-09-15 MED ORDER — PHENYLEPHRINE 40 MCG/ML (10ML) SYRINGE FOR IV PUSH (FOR BLOOD PRESSURE SUPPORT)
PREFILLED_SYRINGE | INTRAVENOUS | Status: AC
  Filled 2017-09-15: qty 10

## 2017-09-15 MED ORDER — ACETAMINOPHEN 650 MG RE SUPP: 650.0000 mg | RECTAL | Status: DC | PRN

## 2017-09-15 MED ORDER — HYDROMORPHONE HCL 1 MG/ML IJ SOLN
INTRAMUSCULAR | Status: DC | PRN
  Administered 2017-09-15 (×4): .25 mg via INTRAVENOUS

## 2017-09-15 MED ORDER — ACETAMINOPHEN 325 MG PO TABS
650.0000 mg | ORAL_TABLET | ORAL | Status: DC | PRN
  Administered 2017-09-15 – 2017-09-16 (×2): 650 mg via ORAL
  Filled 2017-09-15 (×2): qty 2

## 2017-09-15 MED ORDER — OXYCODONE HCL 5 MG PO TABS
5.0000 mg | ORAL_TABLET | ORAL | Status: DC | PRN
  Administered 2017-09-15 – 2017-09-16 (×5): 10 mg via ORAL
  Filled 2017-09-15 (×5): qty 2

## 2017-09-15 MED ORDER — FENTANYL CITRATE (PF) 100 MCG/2ML IJ SOLN
INTRAMUSCULAR | Status: DC | PRN
  Administered 2017-09-15: 50 ug via INTRAVENOUS
  Administered 2017-09-15: 100 ug via INTRAVENOUS
  Administered 2017-09-15: 50 ug via INTRAVENOUS
  Administered 2017-09-15 (×2): 25 ug via INTRAVENOUS

## 2017-09-15 MED ORDER — HYDROMORPHONE HCL 1 MG/ML IJ SOLN: 0.5000 mg | INTRAMUSCULAR | Status: DC | PRN

## 2017-09-15 MED ORDER — ONDANSETRON HCL 4 MG/2ML IJ SOLN: 4.0000 mg | Freq: Four times a day (QID) | INTRAMUSCULAR | Status: DC | PRN

## 2017-09-15 MED ORDER — LACTATED RINGERS IV SOLN
INTRAVENOUS | Status: DC
  Administered 2017-09-15 (×5): via INTRAVENOUS

## 2017-09-15 MED ORDER — MIDAZOLAM HCL 2 MG/2ML IJ SOLN
INTRAMUSCULAR | Status: AC
  Filled 2017-09-15: qty 2

## 2017-09-15 MED ORDER — BUPIVACAINE HCL (PF) 0.5 % IJ SOLN
INTRAMUSCULAR | Status: AC
  Filled 2017-09-15: qty 30

## 2017-09-15 MED ORDER — MEPERIDINE HCL 25 MG/ML IJ SOLN: 6.2500 mg | INTRAMUSCULAR | Status: DC | PRN

## 2017-09-15 MED ORDER — DOCUSATE SODIUM 100 MG PO CAPS
100.0000 mg | ORAL_CAPSULE | Freq: Two times a day (BID) | ORAL | Status: DC
  Administered 2017-09-15 – 2017-09-16 (×2): 100 mg via ORAL
  Filled 2017-09-15 (×2): qty 1

## 2017-09-15 MED ORDER — HYDROMORPHONE HCL 1 MG/ML IJ SOLN
INTRAMUSCULAR | Status: AC
  Filled 2017-09-15: qty 1

## 2017-09-15 MED ORDER — FENTANYL CITRATE (PF) 250 MCG/5ML IJ SOLN
INTRAMUSCULAR | Status: AC
  Filled 2017-09-15: qty 5

## 2017-09-15 SURGICAL SUPPLY — 77 items
BENZOIN TINCTURE PRP APPL 2/3 (GAUZE/BANDAGES/DRESSINGS) ×3 IMPLANT
BUR ROUND FLUTED 4 SOFT TCH (BURR) ×2 IMPLANT
BUR ROUND FLUTED 4MM SOFT TCH (BURR) ×1
CAP SPINAL LOCKING TI (Cap) ×18 IMPLANT
CLOSURE WOUND 1/2 X4 (GAUZE/BANDAGES/DRESSINGS) ×1
CORDS BIPOLAR (ELECTRODE) IMPLANT
COVER BACK TABLE 80X110 HD (DRAPES) ×3 IMPLANT
COVER MAYO STAND STRL (DRAPES) ×3 IMPLANT
COVER SURGICAL LIGHT HANDLE (MISCELLANEOUS) IMPLANT
DERMABOND ADVANCED (GAUZE/BANDAGES/DRESSINGS) ×2
DERMABOND ADVANCED .7 DNX12 (GAUZE/BANDAGES/DRESSINGS) ×1 IMPLANT
DRAPE C-ARM 42X72 X-RAY (DRAPES) ×3 IMPLANT
DRAPE MICROSCOPE LEICA (MISCELLANEOUS) ×3 IMPLANT
DRAPE SURG 17X23 STRL (DRAPES) ×3 IMPLANT
DRSG EMULSION OIL 3X3 NADH (GAUZE/BANDAGES/DRESSINGS) IMPLANT
DRSG MEPILEX BORDER 4X4 (GAUZE/BANDAGES/DRESSINGS) IMPLANT
DRSG MEPILEX BORDER 4X8 (GAUZE/BANDAGES/DRESSINGS) ×3 IMPLANT
DRSG PAD ABDOMINAL 8X10 ST (GAUZE/BANDAGES/DRESSINGS) IMPLANT
DURAPREP 26ML APPLICATOR (WOUND CARE) ×3 IMPLANT
ELECT BLADE 4.0 EZ CLEAN MEGAD (MISCELLANEOUS) ×3
ELECT CAUTERY BLADE 6.4 (BLADE) ×3 IMPLANT
ELECT REM PT RETURN 9FT ADLT (ELECTROSURGICAL) ×3
ELECTRODE BLDE 4.0 EZ CLN MEGD (MISCELLANEOUS) ×1 IMPLANT
ELECTRODE REM PT RTRN 9FT ADLT (ELECTROSURGICAL) ×1 IMPLANT
EVACUATOR 1/8 PVC DRAIN (DRAIN) ×3 IMPLANT
GLOVE BIOGEL PI IND STRL 6.5 (GLOVE) ×2 IMPLANT
GLOVE BIOGEL PI IND STRL 7.0 (GLOVE) ×2 IMPLANT
GLOVE BIOGEL PI IND STRL 8 (GLOVE) ×3 IMPLANT
GLOVE BIOGEL PI INDICATOR 6.5 (GLOVE) ×4
GLOVE BIOGEL PI INDICATOR 7.0 (GLOVE) ×4
GLOVE BIOGEL PI INDICATOR 8 (GLOVE) ×6
GLOVE ORTHO TXT STRL SZ7.5 (GLOVE) ×9 IMPLANT
GLOVE SURG SS PI 6.5 STRL IVOR (GLOVE) ×9 IMPLANT
GOWN STRL REUS W/ TWL LRG LVL3 (GOWN DISPOSABLE) ×2 IMPLANT
GOWN STRL REUS W/ TWL XL LVL3 (GOWN DISPOSABLE) ×2 IMPLANT
GOWN STRL REUS W/TWL 2XL LVL3 (GOWN DISPOSABLE) ×3 IMPLANT
GOWN STRL REUS W/TWL LRG LVL3 (GOWN DISPOSABLE) ×4
GOWN STRL REUS W/TWL XL LVL3 (GOWN DISPOSABLE) ×4
HEMOSTAT SURGICEL 2X14 (HEMOSTASIS) IMPLANT
KIT BASIN OR (CUSTOM PROCEDURE TRAY) ×3 IMPLANT
KIT POSITION SURG JACKSON T1 (MISCELLANEOUS) ×3 IMPLANT
KIT ROOM TURNOVER OR (KITS) ×3 IMPLANT
MANIFOLD NEPTUNE II (INSTRUMENTS) IMPLANT
NDL SUT .5 MAYO 1.404X.05X (NEEDLE) ×1 IMPLANT
NEEDLE MAYO TAPER (NEEDLE) ×2
NS IRRIG 1000ML POUR BTL (IV SOLUTION) ×3 IMPLANT
PACK LAMINECTOMY ORTHO (CUSTOM PROCEDURE TRAY) ×3 IMPLANT
PAD ARMBOARD 7.5X6 YLW CONV (MISCELLANEOUS) ×3 IMPLANT
PATTIES SURGICAL .5 X.5 (GAUZE/BANDAGES/DRESSINGS) IMPLANT
PATTIES SURGICAL .75X.75 (GAUZE/BANDAGES/DRESSINGS) ×3 IMPLANT
ROD 40MM (Rod) ×2 IMPLANT
ROD 45MM (Rod) ×2 IMPLANT
ROD SPNL CVD 40X5.5XHRD NS (Rod) ×1 IMPLANT
ROD SPNL CVD 45X5.5XHRD NS (Rod) ×1 IMPLANT
RUBBERBAND STERILE (MISCELLANEOUS) ×6 IMPLANT
SCREW MATRIX MIS 6.0X30MM (Screw) ×6 IMPLANT
SCREW MATRIX MIS 6.0X35MM (Screw) ×6 IMPLANT
SPACER TPAL 10X8X28MM (Spacer) ×3 IMPLANT
SPONGE LAP 18X18 X RAY DECT (DISPOSABLE) ×3 IMPLANT
SPONGE LAP 4X18 X RAY DECT (DISPOSABLE) IMPLANT
SPONGE SURGIFOAM ABS GEL 100 (HEMOSTASIS) ×3 IMPLANT
STAPLER VISISTAT 35W (STAPLE) IMPLANT
STRIP CLOSURE SKIN 1/2X4 (GAUZE/BANDAGES/DRESSINGS) ×2 IMPLANT
SURGIFLO W/THROMBIN 8M KIT (HEMOSTASIS) ×3 IMPLANT
SUT BONE WAX W31G (SUTURE) ×3 IMPLANT
SUT VIC AB 1 CTX 27 (SUTURE) ×6 IMPLANT
SUT VIC AB 2-0 CT1 27 (SUTURE) ×2
SUT VIC AB 2-0 CT1 TAPERPNT 27 (SUTURE) ×1 IMPLANT
SUT VICRYL 0 TIES 12 18 (SUTURE) ×3 IMPLANT
SUT VICRYL 4-0 PS2 18IN ABS (SUTURE) ×3 IMPLANT
SUT VICRYL AB 2 0 TIES (SUTURE) ×3 IMPLANT
TAP CANN 5MM (TAP) ×3 IMPLANT
TOWEL OR 17X24 6PK STRL BLUE (TOWEL DISPOSABLE) ×3 IMPLANT
TOWEL OR 17X26 10 PK STRL BLUE (TOWEL DISPOSABLE) ×3 IMPLANT
TRAY FOLEY W/METER SILVER 16FR (SET/KITS/TRAYS/PACK) ×3 IMPLANT
WATER STERILE IRR 1000ML POUR (IV SOLUTION) ×3 IMPLANT
YANKAUER SUCT BULB TIP NO VENT (SUCTIONS) ×3 IMPLANT

## 2017-09-15 NOTE — Anesthesia Procedure Notes (Signed)
Procedure Name: Intubation Date/Time: 09/15/2017 1:00 PM Performed by: Scheryl Darter Pre-anesthesia Checklist: Patient identified, Emergency Drugs available, Suction available and Patient being monitored Patient Re-evaluated:Patient Re-evaluated prior to induction Oxygen Delivery Method: Circle System Utilized Preoxygenation: Pre-oxygenation with 100% oxygen Induction Type: IV induction Ventilation: Mask ventilation without difficulty Grade View: Grade I Tube type: Oral Tube size: 7.5 mm Number of attempts: 1 Airway Equipment and Method: Stylet and Oral airway Placement Confirmation: ETT inserted through vocal cords under direct vision,  positive ETCO2 and breath sounds checked- equal and bilateral Secured at: 22 cm Tube secured with: Tape Dental Injury: Teeth and Oropharynx as per pre-operative assessment

## 2017-09-15 NOTE — Brief Op Note (Signed)
09/15/2017  5:14 PM  PATIENT:  Darlene Black  41 y.o. female  PRE-OPERATIVE DIAGNOSIS:  Lumbar Five-Sacral One Biforaminal Stenosis, Retrolisthesis  POST-OPERATIVE DIAGNOSIS:  Lumbar Five-Sacral One Biforaminal Stenosis,Retrolisthesis  PROCEDURE:  Procedure(s) with comments: Left Lumbar Five-Sacral One Transforaminal Lumbar Interbody Fusion, Pedicle Instrumentation (Left) - left  SURGEON:  Surgeon(s) and Role:    * [name], MD - Primary  PHYSICIAN ASSISTANT: Benjiman Core   ANESTHESIA:   general  EBL:  Total I/O In: 3250 [I.V.:3000; IV Piggyback:250] Out: 1210 [Urine:760; Blood:450]  BLOOD ADMINISTERED:none  DRAINS: none   LOCAL MEDICATIONS USED:  MARCAINE     SPECIMEN:  No Specimen  DISPOSITION OF SPECIMEN:  N/A  COUNTS:  YES  TOURNIQUET:  * No tourniquets in log *  DICTATION: .Dragon Dictation  PLAN OF CARE: Admit to inpatient   PATIENT DISPOSITION:  PACU - hemodynamically stable.

## 2017-09-15 NOTE — H&P (Signed)
Darlene Black is an 41 y.o. female.   Chief Complaint: Back pain and leg pain HPI: Patient with history of L5-S1 stenosis and the above complaint presents for surgical intervention. Progressively worsening symptoms. Failed conservative treatment.  Past Medical History:  Diagnosis Date  . Arthritis    lumbar,   . Fibroid   . History of kidney stones    + told that she had calculi, unsure of whether she has passed them    . Hypertension    had been on lisinopril prior to pregnacy 2013-was d/c and has not been restarted on anything  . Lumbar disc disease   . Obesity (BMI 30-39.9)   . Paroxysmal atrial fibrillation ([place]) 11/29/2016   CHA2DS2VASC score 2    onset 11/29/2016  . Seizures ([place])    school age- primary. x1 only , related to febrile episode.     Past Surgical History:  Procedure Laterality Date  . CERVICAL LAMINECTOMY  01/2010   C6 metal bracing   . CESAREAN SECTION  10/10/2012   Procedure: CESAREAN SECTION;x1  Surgeon: Frederico Hamman, MD;  Location: [facility];  Service: Obstetrics;  Laterality: N/A;  . DILITATION & CURRETTAGE/HYSTROSCOPY WITH HYDROTHERMAL ABLATION N/A 10/16/2015   Procedure: DILATATION & CURETTAGE/HYSTEROSCOPY WITH HYDROTHERMAL ABLATION;  Surgeon: Frederico Hamman, MD;  Location: [facility];  Service: Gynecology;  Laterality: N/A;  . LAPAROSCOPIC TUBAL LIGATION  01/18/2013   Procedure: LAPAROSCOPIC TUBAL LIGATION;  Surgeon: Frederico Hamman, MD;  Location: [facility];  Service: Gynecology;  Laterality: Bilateral;  . TUBAL LIGATION      Family History  Problem Relation Age of Onset  . Hypertension Mother   . Heart disease Mother   . Arthritis Mother   . Hyperlipidemia Mother   . Hypertension Maternal Grandmother   . Heart disease Maternal Grandmother   . Arthritis Maternal Grandmother   . Hypertension Maternal Grandfather   . Heart disease Maternal Grandfather   . Anesthesia problems Neg Hx   . Other Neg Hx    Social History:  reports that she quit  smoking about 5 years ago. Her smoking use included Cigarettes. She has a 20.00 pack-year smoking history. She has never used smokeless tobacco. She reports that she does not drink alcohol or use drugs.  Allergies:  Allergies  Allergen Reactions  . Lactose Other (See Comments)    ABDOMINAL  BLOATING AND  GAS     Medications Prior to Admission  Medication Sig Dispense Refill  . amLODipine (NORVASC) 5 MG tablet Take 1 tablet (5 mg total) by mouth daily. 90 tablet 3  . lisinopril-hydrochlorothiazide (PRINZIDE,ZESTORETIC) 20-25 MG tablet Take 1 tablet by mouth daily.    . metoprolol (LOPRESSOR) 50 MG tablet Take 1 tablet (50 mg total) by mouth 2 (two) times daily. 180 tablet 3  . apixaban (ELIQUIS) 5 MG TABS tablet Take 1 tablet (5 mg total) by mouth 2 (two) times daily. 180 tablet 3  . Aspirin-Salicylamide-Caffeine (BC HEADACHE POWDER PO) Take 1 packet by mouth as needed (for headache).      No results found for this or any previous visit (from the past 48 hour(s)). No results found.  Review of Systems  Constitutional: Negative.   HENT: Negative.   Respiratory: Negative.   Cardiovascular: Negative.   Gastrointestinal: Negative.   Musculoskeletal: Positive for back pain.  Skin: Negative.   Neurological: Positive for tingling.  Psychiatric/Behavioral: Negative.     Blood pressure (!) 153/91, pulse 76, temperature 97.9 F (36.6 C), temperature  source Oral, resp. rate 20, weight 234 lb (106.1 kg), last menstrual period 08/29/2017, SpO2 100 %. Physical Exam  Constitutional: She is oriented to person, place, and time. She appears well-developed. No distress.  HENT:  Head: Normocephalic.  Eyes: Pupils are equal, round, and reactive to light. EOM are normal.  Neck: Normal range of motion.  Respiratory: No respiratory distress.  GI: She exhibits no distension.  Musculoskeletal:  Ortho Exam patient is right left sciatic notch tenderness. Reflexes are 2+ and symmetrical. Positive  straight leg raising on the right and left the radiates down the lateral aspect of her foot. Normal hip range of motion SI joint tests are negative. Anterior tib EHL gastrocsoleus is strong. Knees reach full extension minimal crepitus. Good quad and hip flexor strength.   Neurological: She is alert and oriented to person, place, and time.  Skin: Skin is warm and dry.  Psychiatric: She has a normal mood and affect.     Assessment/Plan L5-S1 stenosis, back pain and leg pain.     We will proceed with Left L5-S1 Transforaminal Lumbar Interbody Fusion, Pedicle Instrumentation as scheduled. Surgical procedure along with possible risks, patient discussed. All questions answered. Wishes to proceed.  Benjiman Core, PA-C 09/15/2017, 12:24 PM

## 2017-09-15 NOTE — Interval H&P Note (Signed)
History and Physical Interval Note:  09/15/2017 12:33 PM  Darlene Black  has presented today for surgery, with the diagnosis of L5-S1 Biforaminal Stenosis, Retrolisthesis  The various methods of treatment have been discussed with the patient and family. After consideration of risks, benefits and other options for treatment, the patient has consented to  Procedure(s): Left L5-S1 Transforaminal Lumbar Interbody Fusion, Pedicle Instrumentation (Left) as a surgical intervention .  The patient's history has been reviewed, patient examined, no change in status, stable for surgery.  I have reviewed the patient's chart and labs.  Questions were answered to the patient's satisfaction.     [name]

## 2017-09-15 NOTE — Transfer of Care (Signed)
Immediate Anesthesia Transfer of Care Note  Patient: Darlene Black  Procedure(s) Performed: Left Lumbar Five-Sacral One Transforaminal Lumbar Interbody Fusion, Pedicle Instrumentation (Left Spine Lumbar)  Patient Location: PACU  Anesthesia Type:General  Level of Consciousness: awake, alert , oriented and patient cooperative  Airway & Oxygen Therapy: Patient Spontanous Breathing and Patient connected to nasal cannula oxygen  Post-op Assessment: Report given to RN and Post -op Vital signs reviewed and stable  Post vital signs: Reviewed  Last Vitals: 130/82, 88, 18, 100% Vitals:   09/15/17 1035  BP: (!) 153/91  Pulse: 76  Resp: 20  Temp: 36.6 C  SpO2: 100%    Last Pain:  Vitals:   09/15/17 1043  TempSrc:   PainSc: 8          Complications: No apparent anesthesia complications

## 2017-09-16 LAB — BASIC METABOLIC PANEL
ANION GAP: 5 (ref 5–15)
BUN: 8 mg/dL (ref 6–20)
CALCIUM: 8.5 mg/dL — AB (ref 8.9–10.3)
CHLORIDE: 103 mmol/L (ref 101–111)
CO2: 28 mmol/L (ref 22–32)
Creatinine, Ser: 0.87 mg/dL (ref 0.44–1.00)
Glucose, Bld: 103 mg/dL — ABNORMAL HIGH (ref 65–99)
Potassium: 4.2 mmol/L (ref 3.5–5.1)
SODIUM: 136 mmol/L (ref 135–145)

## 2017-09-16 LAB — CBC
HCT: 25.9 % — ABNORMAL LOW (ref 36.0–46.0)
Hemoglobin: 9 g/dL — ABNORMAL LOW (ref 12.0–15.0)
MCH: 28.5 pg (ref 26.0–34.0)
MCHC: 34.7 g/dL (ref 30.0–36.0)
MCV: 82 fL (ref 78.0–100.0)
PLATELETS: 178 10*3/uL (ref 150–400)
RBC: 3.16 MIL/uL — AB (ref 3.87–5.11)
RDW: 12.6 % (ref 11.5–15.5)
WBC: 4.7 10*3/uL (ref 4.0–10.5)

## 2017-09-16 MED ORDER — INFLUENZA VAC SPLIT QUAD 0.5 ML IM SUSY: 0.5000 mL | PREFILLED_SYRINGE | INTRAMUSCULAR | Status: DC

## 2017-09-16 MED ORDER — METHOCARBAMOL 500 MG PO TABS
500.0000 mg | ORAL_TABLET | Freq: Four times a day (QID) | ORAL | 0 refills | Status: DC | PRN
Start: 2017-09-16 — End: 2017-09-25

## 2017-09-16 MED ORDER — ASPIRIN EC 325 MG PO TBEC: 325.0000 mg | DELAYED_RELEASE_TABLET | Freq: Every day | ORAL | 0 refills | Status: AC

## 2017-09-16 MED ORDER — APIXABAN 5 MG PO TABS: 5.0000 mg | ORAL_TABLET | Freq: Two times a day (BID) | ORAL | 3 refills | Status: DC

## 2017-09-16 MED ORDER — OXYCODONE-ACETAMINOPHEN 10-325 MG PO TABS: 1.0000 | ORAL_TABLET | Freq: Four times a day (QID) | ORAL | 0 refills | Status: DC | PRN

## 2017-09-16 NOTE — Op Note (Signed)
NAME:  Darlene Black, Darlene Black NO.:  [id number]  MEDICAL RECORD NO.:  [id number]  LOCATION:  PERIO                        FACILITY:  [facility]  PHYSICIAN:  Jonelle Bann C. Lorin Mercy, M.D.    DATE OF BIRTH:  1976/07/08  DATE OF PROCEDURE:  09/15/2017 DATE OF DISCHARGE:                              OPERATIVE REPORT   PREOPERATIVE DIAGNOSIS:  L5-S1 disk protrusion with biforaminal stenosis and central stenosis with retrolisthesis.  POSTOPERATIVE DIAGNOSIS:  L5-S1 disk protrusion with biforaminal stenosis and central stenosis with retrolisthesis.  PROCEDURE:  L5-S1 transforaminal lumbar interbody fusion cage inserted from left side with pedicle instrumentation and local interbody bone and bilateral transverse process fusion.  SURGEON:  Karolee Meloni C. Lorin Mercy, M.D.  ESTIMATED BLOOD LOSS:  700 mL.  DRAINS:  None.  IMPLANTS:  A De[employer] Synthes matrix screw system; 9 x 28 mm T-PAL cage, 45 and 40 mm rod; 6 x 35 mm screws in L5 and 6 x 30 mm screws in S1.  ASSISTANT:  Alyson Locket. Ricard Dillon, PA-C, medically necessary and present for the entire procedure.  ANESTHESIA:  General plus Marcaine local.  PROCEDURE:  After induction of general anesthesia, orotracheal intubation, and Foley catheter placement, the patient placed on the spine frame, careful padding and positioning.  Back was prepped with DuraPrep.  Preoperative Ancef was given.  Area was squared with towels, sterile skin marker in the middle.  Betadine, Steri-Drape, and laminectomy sheets and drapes.  Incision was made based on palpable landmarks and a Kocher clamp was placed.  Overall, the disk space confirmed with lateral C-arm appropriate level.  Deep __________ was placed.  There was very thick adipose layer.  Tissue was taken out to the transverse processes and sacral ala.  Decompression was performed, removing the lamina of L5, top portion of S1, removing bone at the level of the pedicle, both right and left for a wide decompression.  There  was severe foraminal stenosis on the left side.  An osteotome was used to remove the facet joint.  Exposure of the lateral aspect of the disk and diskectomy was performed.  There was broad-based disk protrusion, and chunks of disk were removed.  The anterior annulus was intact. Progressive cleaning of the disks; ring curettes, angled curettes, and angled rasps or straight rasps were used for preparation of the endplates.  Progression up to an 8 banana cage which was extremely tight.  An 8 cage was opened, packed with the patient's bone that had been meticulously cleaned by the scrub nurse of soft tissue, __________ into small pieces, packed in the cage, and then was inserted after as much bone as possible could be packed anterior to the position of the cage so that the 7 mm trial would barely fit in.  An 8 cage was impacted, checked under fluoro, kicked over until it was in the midline, well-countersunk.  Screws were then inserted in standard fashion, starting with the bur, the awl, pedicle, Joystick pedicle feeler checking under fluoroscopy, tapping, feeling again central canal with the hockey stick to make sure there was no penetration through the inferior or medial aspect of the pedicle and then placement of the screw and then rechecking fluoroscopy.  Decortication laterally.  Once all screws were in satisfactory position, spot pictures were taken.  The S1 screw on the right appeared slightly lateral, but was solidly in bone and probed anteriorly.  It was only the 30 mm screw.  Rods were inserted, caps placed.  The left side where the cage had been inserted was compressed first, followed by the opposite side, both compressed well and there was excellent bone quality for placement of the screws with the hard bone and tight-fitting screws.  Irrigation and placement of the remaining bone laterally out of the transverse process. Subcutaneous tissue was reapproximated.  Marcaine was  infiltrated in the skin.  Skin closure, postop dressing, and transferred to the recovery room.  Dura was intact.  Lateral gutters were checked for any pieces of bone and cage was checked before closure.  Padding count, instrument count, and needle count were correct.  There was good wide decompression of the dura and both foramina were loose.     Keyosha Tiedt C. Lorin Mercy, M.D.     MCY/MEDQ  D:  09/15/2017  T:  09/16/2017  Job:  [id number]

## 2017-09-16 NOTE — Progress Notes (Signed)
Patient is discharged from room 3C02 at this time. Alert and in stable condition. IV site d/c'd and instructions read to patient with understanding verbalized. Left unit via wheelchair with all belongings at side. 

## 2017-09-16 NOTE — Discharge Instructions (Signed)
Orthopedic discharge instructions   -Okay to shower 5 days postop. Do daily wound checks and dressing changes with 4 x 4 gauze and tape. Do not apply any creams or ointments to incision.  -No lifting bending twisting driving.  -Lumbar brace must be on at all times when out of bed.

## 2017-09-16 NOTE — Progress Notes (Signed)
Subjective: Patient doing well. Pain control. Preoperative left leg numbness is gone. Complains of some low back pain and bilateral buttock/hip pain. No voiding issues. Has done hall ambulation. Wants to go home this morning.  Objective: Vital signs in last 24 hours: Temp:  [97.9 F (36.6 C)-98.9 F (37.2 C)] 98.9 F (37.2 C) (10/04 0743) Pulse Rate:  [76-119] 84 (10/04 0743) Resp:  [12-20] 20 (10/04 0743) BP: (114-153)/(46-99) 132/79 (10/04 0743) SpO2:  [96 %-100 %] 100 % (10/04 0743) Weight:  [234 lb (106.1 kg)] 234 lb (106.1 kg) (10/03 1035)  Intake/Output from previous day: 10/03 0701 - 10/04 0700 In: 3300 [I.V.:3000; IV Piggyback:250] Out: 2360 [Urine:1910; Blood:450] Intake/Output this shift: No intake/output data recorded.   Recent Labs  09/16/17 0245  HGB 9.0*    Recent Labs  09/16/17 0245  WBC 4.7  RBC 3.16*  HCT 25.9*  PLT 178    Recent Labs  09/16/17 0245  NA 136  K 4.2  CL 103  CO2 28  BUN 8  CREATININE 0.87  GLUCOSE 103*  CALCIUM 8.5*   No results for input(s): LABPT, INR in the last 72 hours.  Exam Very pleasant female alert and oriented in no acute distress. Surgical incision looks good. New dressing applied. Bilateral calves are nontender. Neurovascularly intact. No focal motor deficits.  Assessment/Plan: Discharge home today. Patient does have history of age fibrillation and we'll hold eliquis 4 days per Dr. Lorin Mercy. We'll use aspirin 325 mg by mouth daily until eliquis is resumed.  Prescription on chart for Percocet and Robaxin. Follow-up with Dr. Lorin Mercy in 1 week for recheck. Returns sooner needed. Call office if there are any immediate questions or concerns.   Benjiman Core 09/16/2017, 8:50 AM

## 2017-09-16 NOTE — Evaluation (Signed)
Occupational Therapy Evaluation and Discharge Patient Details Name: Darlene Black MRN: [id number] DOB: 1976-10-18 Today's Date: 09/16/2017    History of Present Illness Pt is a 41 y/o female who presents s/p L5-S1 TLIF on 09/15/17. PMH significant for seizures, paroxysmal a-fib, HTN   Clinical Impression   This 41 yo female admitted and underwent above presents to acute OT with all education completed, we will D/C from acute OT.    Follow Up Recommendations  No OT follow up;Supervision/Assistance - 24 hour    Equipment Recommendations  3 in 1 bedside commode;Tub/shower bench       Precautions / Restrictions Precautions Precautions: Fall;Back Precaution Booklet Issued: Yes (comment) Precaution Comments: Reviewed handout with pt.  Required Braces or Orthoses: Spinal Brace Spinal Brace: Lumbar corset;Applied in sitting position Restrictions Weight Bearing Restrictions: No      Mobility Bed Mobility Overal bed mobility: Needs Assistance Bed Mobility: Sit to Sidelying         Sit to sidelying: Min guard General bed mobility comments: Pt received sitting EOB with NT present.   Transfers Overall transfer level: Needs assistance Equipment used: None Transfers: Sit to/from Stand Sit to Stand: Min assist         General transfer comment: Assist provided for pt to achieve full stand from EOB (bed in lowest position), and from the toilet. VC's for proper hand placement on seated surface for safety.     Balance Overall balance assessment: Needs assistance Sitting-balance support: Feet supported;No upper extremity supported Sitting balance-Leahy Scale: Fair     Standing balance support: No upper extremity supported;During functional activity Standing balance-Leahy Scale: Poor Standing balance comment: Reaching for external support for steadying self                           ADL either performed or assessed with clinical judgement   ADL                                          General ADL Comments: Educated pt on use of wet wipes for back peri care to help with not twisting as much, use of 2 cups for brushing teeth to avoid bending over the sink, sequence of dressing for efficiency, initally not sitting for more than 30 minutes but could work her way up to sitting as much as an hour depending on how stiff she feels when she gets up from sitting.. She reports family can A her with LB dressing until she can be able to do it by herself again (normally she can bring her legs up to her and cross thm, but unable at this time due to pain). We discussed the use of a tub bench and she is suppose to let the staff know if she would like one before she D/C's or will get one on her own     Vision Patient Visual Report: No change from baseline              Pertinent Vitals/Pain Pain Assessment: 0-10 Pain Score: 8  Faces Pain Scale: Hurts even more Pain Location: Incision site Pain Descriptors / Indicators: Operative site guarding;Discomfort Pain Intervention(s): Limited activity within patient's tolerance;Monitored during session;Repositioned     Hand Dominance Right   Extremity/Trunk Assessment Upper Extremity Assessment Upper Extremity Assessment: Overall WFL for tasks assessed   Lower Extremity Assessment Lower  Extremity Assessment: Generalized weakness   Cervical / Trunk Assessment Cervical / Trunk Assessment: Other exceptions Cervical / Trunk Exceptions: s/p surgery   Communication Communication Communication: No difficulties   Cognition Arousal/Alertness: Awake/alert Behavior During Therapy: WFL for tasks assessed/performed Overall Cognitive Status: Within Functional Limits for tasks assessed                                                Home Living Family/patient expects to be discharged to:: Private residence Living Arrangements: Spouse/significant other Available Help at Discharge:  Family;Available 24 hours/day Type of Home: House Home Access: Stairs to enter [employer] of Steps: 3 Entrance Stairs-Rails: None Home Layout: One level     Bathroom Shower/Tub: Teacher, early years/pre: Standard     Home Equipment: None          Prior Functioning/Environment Level of Independence: Independent                 OT Problem List: Decreased strength;Decreased range of motion;Impaired balance (sitting and/or standing);Obesity;Pain         OT Goals(Current goals can be found in the care plan section) Acute Rehab OT Goals Patient Stated Goal: return to PLOF  OT Frequency:                AM-PAC PT "6 Clicks" Daily Activity     Outcome Measure Help from another person eating meals?: None Help from another person taking care of personal grooming?: A Little Help from another person toileting, which includes using toliet, bedpan, or urinal?: A Little Help from another person bathing (including washing, rinsing, drying)?: A Lot Help from another person to put on and taking off regular upper body clothing?: A Little Help from another person to put on and taking off regular lower body clothing?: A Lot 6 Click Score: 17   End of Session Equipment Utilized During Treatment: Back brace Nurse Communication:  (need for DME)  Activity Tolerance: Patient tolerated treatment well (despite increased pain) Patient left: in bed;with call bell/phone within reach  OT Visit Diagnosis: Unsteadiness on feet (R26.81);Pain Pain - part of body:  (back)                Time: 7902-4097 OT Time Calculation (min): 33 min Charges:  OT General Charges $OT Visit: 1 Visit OT Evaluation $OT Eval Moderate Complexity: 1 Mod OT Treatments $Self Care/Home Management : 8-22 mins [place], OTR/L [phone] 09/16/2017

## 2017-09-16 NOTE — Evaluation (Signed)
Physical Therapy Evaluation Patient Details Name: Darlene Black MRN: [id number] DOB: [birth year] Today's Date: 09/16/2017   History of Present Illness  Pt is a 41 y/o female who presents s/p L5-S1 TLIF on 09/15/17. PMH significant for seizures, paroxysmal a-fib, HTN  Clinical Impression  Pt admitted with above diagnosis. Pt currently with functional limitations due to the deficits listed below (see PT Problem List). At the time of PT eval pt was limited by pain and fatigue. Was able to mobilize with increased safety and independence with RW. Pt was not able to attempt stair training this session, and will defer to next PT session. Pt was education on precautions, walking program, car transfer, activity progression, and stair negotiation (verbally). Pt will benefit from skilled PT to increase their independence and safety with mobility to allow discharge to the venue listed below.       Follow Up Recommendations No PT follow up;Supervision for mobility/OOB    Equipment Recommendations  Rolling walker with 5" wheels;3in1 (PT)    Recommendations for Other Services       Precautions / Restrictions Precautions Precautions: Fall;Back Precaution Booklet Issued: Yes (comment) Precaution Comments: Reviewed handout with pt. She was cued for precautions during functional mobility.  Required Braces or Orthoses: Spinal Brace Spinal Brace: Lumbar corset;Applied in sitting position Restrictions Weight Bearing Restrictions: No      Mobility  Bed Mobility               General bed mobility comments: Pt received sitting EOB with NT present.   Transfers Overall transfer level: Needs assistance Equipment used: None Transfers: Sit to/from Stand Sit to Stand: Min assist         General transfer comment: Assist provided for pt to achieve full stand from EOB (bed in lowest position), and from the toilet. VC's for proper hand placement on seated surface for safety.    Ambulation/Gait Ambulation/Gait assistance: Min guard Ambulation Distance (Feet): 300 Feet Assistive device: Rolling walker (2 wheeled) Gait Pattern/deviations: Step-through pattern;Decreased stride length Gait velocity: Decreased Gait velocity interpretation: Below normal speed for age/gender General Gait Details: VC's for improved posture and general safety with the RW. Pt initially without AD, however moving very slow, guarded, and with notable unsteadiness. Overall gait improved with RW use.   Stairs            Wheelchair Mobility    Modified Rankin (Stroke Patients Only)       Balance Overall balance assessment: Needs assistance Sitting-balance support: Feet supported;No upper extremity supported Sitting balance-Leahy Scale: Fair     Standing balance support: No upper extremity supported;During functional activity Standing balance-Leahy Scale: Poor Standing balance comment: Reaching for external support when attempting dynamic standing activity.                              Pertinent Vitals/Pain Pain Assessment: Faces Faces Pain Scale: Hurts even more Pain Location: Incision site Pain Descriptors / Indicators: Operative site guarding;Discomfort Pain Intervention(s): Limited activity within patient's tolerance;Monitored during session;Repositioned    Home Living Family/patient expects to be discharged to:: Private residence Living Arrangements: Spouse/significant other Available Help at Discharge: Family;Available 24 hours/day Type of Home: House Home Access: Stairs to enter Entrance Stairs-Rails: None Entrance Stairs-Number of Steps: 3 Home Layout: One level Home Equipment: None      Prior Function Level of Independence: Independent               Hand  Dominance        Extremity/Trunk Assessment   Upper Extremity Assessment Upper Extremity Assessment: Defer to OT evaluation    Lower Extremity Assessment Lower Extremity  Assessment: Generalized weakness    Cervical / Trunk Assessment Cervical / Trunk Assessment: Other exceptions Cervical / Trunk Exceptions: s/p surgery  Communication   Communication: No difficulties  Cognition Arousal/Alertness: Awake/alert Behavior During Therapy: WFL for tasks assessed/performed Overall Cognitive Status: Within Functional Limits for tasks assessed                                        General Comments      Exercises     Assessment/Plan    PT Assessment Patient needs continued PT services  PT Problem List Decreased strength;Decreased range of motion;Decreased activity tolerance;Decreased balance;Decreased mobility;Decreased knowledge of use of DME;Decreased safety awareness;Decreased knowledge of precautions;Pain       PT Treatment Interventions DME instruction;Gait training;Stair training;Functional mobility training;Therapeutic activities;Therapeutic exercise;Neuromuscular re-education;Patient/family education    PT Goals (Current goals can be found in the Care Plan section)  Acute Rehab PT Goals Patient Stated Goal: return to PLOF PT Goal Formulation: With patient Time For Goal Achievement: 09/23/17 Potential to Achieve Goals: Good    Frequency Min 5X/week   Barriers to discharge        Co-evaluation               AM-PAC PT "6 Clicks" Daily Activity  Outcome Measure Difficulty turning over in bed (including adjusting bedclothes, sheets and blankets)?: None Difficulty moving from lying on back to sitting on the side of the bed? : A Little Difficulty sitting down on and standing up from a chair with arms (e.g., wheelchair, bedside commode, etc,.)?: A Little Help needed moving to and from a bed to chair (including a wheelchair)?: A Little Help needed walking in hospital room?: A Little Help needed climbing 3-5 steps with a railing? : A Little 6 Click Score: 19    End of Session Equipment Utilized During Treatment: Gait  belt;Back brace Activity Tolerance: Patient limited by fatigue;Patient limited by pain Patient left: in chair;with call bell/phone within reach Nurse Communication: Mobility status PT Visit Diagnosis: Unsteadiness on feet (R26.81);Pain;Other symptoms and signs involving the nervous system (R29.898) Pain - part of body:  (back)    Time: 0927-1000 PT Time Calculation (min) (ACUTE ONLY): 33 min   Charges:   PT Evaluation $PT Eval Moderate Complexity: 1 Mod PT Treatments $Gait Training: 8-22 mins   PT G Codes:        Rolinda Roan, PT, DPT Acute Rehabilitation Services Pager: [place] 09/16/2017, 10:50 AM

## 2017-09-16 NOTE — Anesthesia Postprocedure Evaluation (Signed)
Anesthesia Post Note  Patient: Darlene Black  Procedure(s) Performed: Left Lumbar Five-Sacral One Transforaminal Lumbar Interbody Fusion, Pedicle Instrumentation (Left Spine Lumbar)     Patient location during evaluation: PACU Anesthesia Type: General Level of consciousness: awake and alert Pain management: pain level controlled Vital Signs Assessment: post-procedure vital signs reviewed and stable Respiratory status: spontaneous breathing, nonlabored ventilation, respiratory function stable and patient connected to nasal cannula oxygen Cardiovascular status: blood pressure returned to baseline and stable Postop Assessment: no apparent nausea or vomiting Anesthetic complications: no    Last Vitals:  Vitals:   09/16/17 0743 09/16/17 1153  BP: 132/79 114/70  Pulse: 84 84  Resp: 20 18  Temp: 37.2 C 37.5 C  SpO2: 100% 100%    Last Pain:  Vitals:   09/16/17 1358  TempSrc:   PainSc: 7                  Ehan Freas DAVID

## 2017-09-16 NOTE — Anesthesia Preprocedure Evaluation (Signed)
Anesthesia Evaluation  Patient identified by MRN, date of birth, ID band Patient awake    Reviewed: Allergy & Precautions, NPO status , Patient's Chart, lab work & pertinent test results  Airway Mallampati: II  TM Distance: >3 FB Neck ROM: Full    Dental   Pulmonary former smoker,    Pulmonary exam normal        Cardiovascular hypertension, Pt. on medications Normal cardiovascular exam     Neuro/Psych Seizures -,     GI/Hepatic   Endo/Other    Renal/GU      Musculoskeletal   Abdominal   Peds  Hematology   Anesthesia Other Findings   Reproductive/Obstetrics                             Anesthesia Physical Anesthesia Plan  ASA: II  Anesthesia Plan: General   Post-op Pain Management:    Induction: Intravenous  PONV Risk Score and Plan: 3 and Ondansetron, Dexamethasone, Midazolam and Treatment may vary due to age or medical condition  Airway Management Planned: LMA  Additional Equipment:   Intra-op Plan:   Post-operative Plan: Extubation in OR  Informed Consent: I have reviewed the patients History and Physical, chart, labs and discussed the procedure including the risks, benefits and alternatives for the proposed anesthesia with the patient or authorized representative who has indicated his/her understanding and acceptance.     Plan Discussed with: CRNA and Surgeon  Anesthesia Plan Comments:         Anesthesia Quick Evaluation

## 2017-09-21 NOTE — Discharge Summary (Signed)
Patient ID: Darlene Black MRN: [id number] DOB/AGE: 06-05-1976 41 y.o.  Admit date: 09/15/2017 Discharge date: 09/21/2017  Admission Diagnoses:  Active Problems:   Lumbar stenosis   Discharge Diagnoses:  Active Problems:   Lumbar stenosis  status post Procedure(s): Left Lumbar Five-Sacral One Transforaminal Lumbar Interbody Fusion, Pedicle Instrumentation  Past Medical History:  Diagnosis Date  . Arthritis    lumbar,   . Fibroid   . History of kidney stones    + told that she had calculi, unsure of whether she has passed them    . Hypertension    had been on lisinopril prior to pregnacy 2013-was d/c and has not been restarted on anything  . Lumbar disc disease   . Obesity (BMI 30-39.9)   . Paroxysmal atrial fibrillation ([place]) 11/29/2016   CHA2DS2VASC score 2    onset 11/29/2016  . Seizures ([place])    school age- primary. x1 only , related to febrile episode.     Surgeries: Procedure(s): Left Lumbar Five-Sacral One Transforaminal Lumbar Interbody Fusion, Pedicle Instrumentation on 09/15/2017   Consultants:   Discharged Condition: Improved  Hospital Course: Darlene Black is an 41 y.o. female who was admitted 09/15/2017 for operative treatment of lumbar stenosis. Patient failed conservative treatments (please see the history and physical for the specifics) and had severe unremitting pain that affects sleep, daily activities and work/hobbies. After pre-op clearance, the patient was taken to the operating room on 09/15/2017 and underwent  Procedure(s): Left Lumbar Five-Sacral One Transforaminal Lumbar Interbody Fusion, Pedicle Instrumentation.    Patient was given perioperative antibiotics:  Anti-infectives    Start     Dose/Rate Route Frequency Ordered Stop   09/15/17 2200  ceFAZolin (ANCEF) IVPB 2g/100 mL premix     2 g 200 mL/hr over 30 Minutes Intravenous Every 8 hours 09/15/17 1839 09/16/17 0601   09/15/17 0918  ceFAZolin (ANCEF) IVPB 2g/100 mL premix     2  g 200 mL/hr over 30 Minutes Intravenous On call to O.R. 09/15/17 4580 09/15/17 1303       Patient was given sequential compression devices and early ambulation to prevent DVT.   Patient benefited maximally from hospital stay and there were no complications. At the time of discharge, the patient was urinating/moving their bowels without difficulty, tolerating a regular diet, pain is controlled with oral pain medications and they have been cleared by PT/OT.   Recent vital signs: No data found.    Recent laboratory studies: No results for input(s): WBC, HGB, HCT, PLT, NA, K, CL, CO2, BUN, CREATININE, GLUCOSE, INR, CALCIUM in the last 72 hours.  Invalid input(s): PT, 2   Discharge Medications:   Allergies as of 09/16/2017      Reactions   Lactose Other (See Comments)   ABDOMINAL  BLOATING AND  GAS       Medication List    STOP taking these medications   BC HEADACHE POWDER PO     TAKE these medications   amLODipine 5 MG tablet Commonly known as:  NORVASC Take 1 tablet (5 mg total) by mouth daily.   apixaban 5 MG Tabs tablet Commonly known as:  ELIQUIS Take 1 tablet (5 mg total) by mouth 2 (two) times daily.   lisinopril-hydrochlorothiazide 20-25 MG tablet Commonly known as:  PRINZIDE,ZESTORETIC Take 1 tablet by mouth daily.   methocarbamol 500 MG tablet Commonly known as:  ROBAXIN Take 1 tablet (500 mg total) by mouth every 6 (six) hours as needed for muscle spasms.  metoprolol tartrate 50 MG tablet Commonly known as:  LOPRESSOR Take 1 tablet (50 mg total) by mouth 2 (two) times daily.   oxyCODONE-acetaminophen 10-325 MG tablet Commonly known as:  PERCOCET Take 1 tablet by mouth every 6 (six) hours as needed for pain.     ASK your doctor about these medications   aspirin EC 325 MG tablet Take 1 tablet (325 mg total) by mouth daily. Ask about: Should I take this medication?       Diagnostic Studies: Dg Lumbar Spine 2-3 Views  Result Date:  09/15/2017 CLINICAL DATA:  L5-S1 fusion EXAM: LUMBAR SPINE - 2-3 VIEW; DG C-ARM 61-120 MIN COMPARISON:  04/28/2017, MRI 02/06/2016 FINDINGS: Total fluoroscopy time was 42 seconds. Two low resolution intraoperative spot views of the lumbar spine. The images demonstrate surgical instruments over the lumbosacral spine. Placement of posterior stabilization rods fixating screws at L5-S1 IMPRESSION: Intraoperative fluoroscopic assistance provided during lumbar spine surgery Electronically Signed   By: Donavan Foil M.D.   On: 09/15/2017 19:45   Mm Digital Screening Bilateral  Result Date: 09/07/2017 CLINICAL DATA:  Screening. EXAM: DIGITAL SCREENING BILATERAL MAMMOGRAM WITH CAD COMPARISON:  None. ACR Breast Density Category b: There are scattered areas of fibroglandular density. FINDINGS: There are possible enlarged lymph nodes in the bilateral axilla. No mammographic abnormalities are identified in the breasts bilaterally. Images were processed with CAD. IMPRESSION: Further evaluation is suggested for possible right axillary lymphadenopathy. Further evaluation is suggested for possible left axillary lymphadenopathy. RECOMMENDATION: Diagnostic mammogram and possibly ultrasound of both axilla. (Code:[id number]) The patient will be contacted regarding the findings, and additional imaging will be scheduled. BI-RADS CATEGORY  0: Incomplete. Need additional imaging evaluation and/or prior mammograms for comparison. Electronically Signed   By: Ammie Ferrier M.D.   On: 09/07/2017 10:31   Dg C-arm 61-120 Min  Result Date: 09/15/2017 CLINICAL DATA:  L5-S1 fusion EXAM: LUMBAR SPINE - 2-3 VIEW; DG C-ARM 61-120 MIN COMPARISON:  04/28/2017, MRI 02/06/2016 FINDINGS: Total fluoroscopy time was 42 seconds. Two low resolution intraoperative spot views of the lumbar spine. The images demonstrate surgical instruments over the lumbosacral spine. Placement of posterior stabilization rods fixating screws at L5-S1 IMPRESSION:  Intraoperative fluoroscopic assistance provided during lumbar spine surgery Electronically Signed   By: Donavan Foil M.D.   On: 09/15/2017 19:45      Follow-up Information    [name], MD. Schedule an appointment as soon as possible for a visit today.   Specialty:  Orthopedic Surgery Why:  Call to schedule an appointment to be seen by Dr. Lorin Mercy one week postop Contact information: [place] [address] [phone]           Discharge Plan:  discharge to home  Disposition:     Signed: Benjiman Core  09/21/2017, 1:21 PM

## 2017-09-22 ENCOUNTER — Ambulatory Visit (INDEPENDENT_AMBULATORY_CARE_PROVIDER_SITE_OTHER): Payer: Self-pay

## 2017-09-22 ENCOUNTER — Encounter (INDEPENDENT_AMBULATORY_CARE_PROVIDER_SITE_OTHER): Payer: Self-pay | Admitting: Surgery

## 2017-09-22 ENCOUNTER — Ambulatory Visit (INDEPENDENT_AMBULATORY_CARE_PROVIDER_SITE_OTHER): Payer: Self-pay | Admitting: Surgery

## 2017-09-22 DIAGNOSIS — M545 Low back pain: Secondary | ICD-10-CM

## 2017-09-22 MED ORDER — OXYCODONE-ACETAMINOPHEN 5-325 MG PO TABS: 1.0000 | ORAL_TABLET | Freq: Four times a day (QID) | ORAL | 0 refills | Status: DC | PRN

## 2017-09-22 NOTE — Progress Notes (Signed)
Post-Op Visit Note   Patient: Darlene Black           Date of Birth: 06/16/1976           MRN: [id number] Visit Date: 09/22/2017 PCP: Vicenta Aly, FNP   Assessment & Plan:  Chief Complaint:  Chief Complaint  Patient presents with  . Lower Back - Routine Post Op  Patient returns. Status post L5-S1 fusion. Date of surgery 09/15/2017. Patient states that she is doing very well. She is pleased at this point. Preoperative left leg symptoms are gone. She does complain of some intermittent right thigh pain but this is improved when she is up and ambulating.      Visit Diagnoses:  1. Low back pain, unspecified back pain laterality, unspecified chronicity, with sciatica presence unspecified     Plan: Patient will continue lumbar brace. Follow-up in 5 weeks for recheck. No lifting, bending, twisting driving.  Given another prescription for Percocet. Will not fill this until she has used up her other prescription.  Follow-Up Instructions: No Follow-up on file.   Orders:  Orders Placed This Encounter  Procedures  . XR Lumbar Spine 2-3 Views   No orders of the defined types were placed in this encounter.   Imaging: No results found.  PMFS History: Patient Active Problem List   Diagnosis Date Noted  . Lumbar stenosis 09/15/2017  . 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 Paroxysmal atrial fibrillation ([place]) 11/29/2016  . Hypertension 11/29/2016  . Current use of long term anticoagulation 11/29/2016  . Atrial fibrillation with rapid ventricular response ([place]) 11/29/2016  . Obesity (BMI 30-39.9)   . Lumbar disc disease    Past Medical History:  Diagnosis Date  . Arthritis    lumbar,   . Fibroid   . History of kidney stones    + told that she had calculi, unsure of whether she has passed them    . Hypertension    had been on lisinopril prior to pregnacy 2013-was d/c and has not been restarted on anything  . Lumbar disc disease   . Obesity (BMI 30-39.9)     . Paroxysmal atrial fibrillation ([place]) 11/29/2016   CHA2DS2VASC score 2    onset 11/29/2016  . Seizures ([place])    school age- primary. x1 only , related to febrile episode.     Family History  Problem Relation Age of Onset  . Hypertension Mother   . Heart disease Mother   . Arthritis Mother   . Hyperlipidemia Mother   . Hypertension Maternal Grandmother   . Heart disease Maternal Grandmother   . Arthritis Maternal Grandmother   . Hypertension Maternal Grandfather   . Heart disease Maternal Grandfather   . Anesthesia problems Neg Hx   . Other Neg Hx     Past Surgical History:  Procedure Laterality Date  . CERVICAL LAMINECTOMY  01/2010   C6 metal bracing   . CESAREAN SECTION  10/10/2012   Procedure: CESAREAN SECTION;x1  Surgeon: Frederico Hamman, MD;  Location: [facility];  Service: Obstetrics;  Laterality: N/A;  . DILITATION & CURRETTAGE/HYSTROSCOPY WITH HYDROTHERMAL ABLATION N/A 10/16/2015   Procedure: DILATATION & CURETTAGE/HYSTEROSCOPY WITH HYDROTHERMAL ABLATION;  Surgeon: Frederico Hamman, MD;  Location: [facility];  Service: Gynecology;  Laterality: N/A;  . LAPAROSCOPIC TUBAL LIGATION  01/18/2013   Procedure: LAPAROSCOPIC TUBAL LIGATION;  Surgeon: Frederico Hamman, MD;  Location: [facility];  Service: Gynecology;  Laterality: Bilateral;  . TUBAL LIGATION  Social History   Occupational History  . Not on file.   Social History Main Topics  . Smoking status: Former Smoker    Packs/day: 1.00    Years: 20.00    Types: Cigarettes    Quit date: 12/28/2011  . Smokeless tobacco: Never Used  . Alcohol use No  . Drug use: No  . Sexual activity: Yes    Birth control/ protection: Surgical     Comment: tubal at 6 weeks   Exam Very pleasant female alert and oriented in no acute distress. Surgical incision is healing very well. No drainage or signs of infection. old Steri-Strips removed and new ones were applied.  Neurologically intact.

## 2017-09-25 ENCOUNTER — Other Ambulatory Visit (INDEPENDENT_AMBULATORY_CARE_PROVIDER_SITE_OTHER): Payer: Self-pay | Admitting: Surgery

## 2017-09-26 ENCOUNTER — Other Ambulatory Visit (INDEPENDENT_AMBULATORY_CARE_PROVIDER_SITE_OTHER): Payer: Self-pay | Admitting: Orthopaedic Surgery

## 2017-09-27 ENCOUNTER — Telehealth (INDEPENDENT_AMBULATORY_CARE_PROVIDER_SITE_OTHER): Payer: Self-pay | Admitting: Orthopaedic Surgery

## 2017-09-27 NOTE — Telephone Encounter (Signed)
Ok for refill? 

## 2017-09-27 NOTE — Telephone Encounter (Signed)
Ok refill robaxin thanks

## 2017-09-27 NOTE — Telephone Encounter (Signed)
Duplicate Message already sent to Dr. Lorin Mercy

## 2017-09-27 NOTE — Telephone Encounter (Signed)
Duplicate message. I have sent pharmacy request to Dr. Lorin Mercy. Awaiting approval.

## 2017-09-27 NOTE — Telephone Encounter (Signed)
Pt needs med refill  Methopeolol (Robaxin)500 mg tablet   Phone number [phone]

## 2017-09-29 ENCOUNTER — Other Ambulatory Visit: Payer: Medicaid Other

## 2017-10-01 ENCOUNTER — Telehealth (INDEPENDENT_AMBULATORY_CARE_PROVIDER_SITE_OTHER): Payer: Self-pay | Admitting: Orthopaedic Surgery

## 2017-10-01 NOTE — Telephone Encounter (Signed)
When completely off pain medication and muscle relaxants

## 2017-10-01 NOTE — Telephone Encounter (Signed)
Please advise on when patient can drive.

## 2017-10-01 NOTE — Telephone Encounter (Signed)
Patient called wanting to know when she can drive. Patient had surgery on 09/15/17. Please call patient as she forgot to ask Dr Lorin Mercy when she would be able to drive

## 2017-10-01 NOTE — Telephone Encounter (Signed)
I called patient and advised. 

## 2017-10-11 ENCOUNTER — Telehealth (INDEPENDENT_AMBULATORY_CARE_PROVIDER_SITE_OTHER): Payer: Self-pay | Admitting: Orthopaedic Surgery

## 2017-10-11 MED ORDER — TRAMADOL HCL 50 MG PO TABS: 50.0000 mg | ORAL_TABLET | Freq: Four times a day (QID) | ORAL | 0 refills | Status: DC | PRN

## 2017-10-11 MED ORDER — METHOCARBAMOL 500 MG PO TABS: ORAL_TABLET | ORAL | 0 refills | Status: DC

## 2017-10-11 NOTE — Telephone Encounter (Signed)
Send in refill of robaxin and send in ultram 50mg  one po q 6 hrs  # 40. I called her and discussed. thanks

## 2017-10-11 NOTE — Telephone Encounter (Signed)
Sent and called to pharmacy.

## 2017-10-11 NOTE — Telephone Encounter (Signed)
I called in to [place]. I called [facility] to disregard Rx for Tramadol. I called patient and advised.

## 2017-10-11 NOTE — Telephone Encounter (Signed)
Please advise 

## 2017-10-11 NOTE — Telephone Encounter (Signed)
Patient called needing Rx refilled (Oxycodone and Methocarbamol)  The number to contact patient is [phone]

## 2017-10-11 NOTE — Telephone Encounter (Signed)
Patient called asked if the Rx for Tramadol can be sent over to [place] at [facility] can not fill the entire Rx. Patient said [facility] can fill the Rx for 40 tabs. [facility] will only fill the Rx for 28 tabs. The number to contact patient is [phone]

## 2017-10-19 ENCOUNTER — Ambulatory Visit (INDEPENDENT_AMBULATORY_CARE_PROVIDER_SITE_OTHER): Payer: Self-pay | Admitting: Orthopaedic Surgery

## 2017-10-19 ENCOUNTER — Encounter (INDEPENDENT_AMBULATORY_CARE_PROVIDER_SITE_OTHER): Payer: Self-pay | Admitting: Orthopaedic Surgery

## 2017-10-19 VITALS — BP 134/85 | HR 92 | Ht 62.5 in | Wt 230.0 lb

## 2017-10-19 DIAGNOSIS — Z981 Arthrodesis status: Secondary | ICD-10-CM

## 2017-10-19 NOTE — Progress Notes (Signed)
   Post-Op Visit Note   Patient: Darlene Black           Date of Birth: 1976-09-21           MRN: [id number] Visit Date: 10/19/2017 PCP: Vicenta Aly, FNP   Assessment & Plan:follow-up L5S18fusion. She has one area in the mid incision were piece of the Vicryl sutures sticking out and this was removed.this was removed today with pickups. No cellulitis she's got good relief of her preop pain. Continue brnth.  Chief Complaint:  Chief Complaint  Patient presents with  . Lower Back - Follow-up   Visit Diagnoses:  1. Status post lumbar spinal fusion     Plan: Continue walking program. She will return in one month repeat AP and lateral lumbar x-ray on return. She is very happy with the surgical result.  Follow-Up Instructions: No Follow-up on file.   Orders:  No orders of the defined types were placed in this encounter.  No orders of the defined types were placed in this encounter.   Imaging: No results found.  PMFS History: Patient Active Problem List   Diagnosis Date Noted  . Lumbar stenosis 09/15/2017  . 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 Paroxysmal atrial fibrillation ([place]) 11/29/2016  . Hypertension 11/29/2016  . Current use of long term anticoagulation 11/29/2016  . Atrial fibrillation with rapid ventricular response ([place]) 11/29/2016  . Obesity (BMI 30-39.9)   . Lumbar disc disease    Past Medical History:  Diagnosis Date  . Arthritis    lumbar,   . Fibroid   . History of kidney stones    + told that she had calculi, unsure of whether she has passed them    . Hypertension    had been on lisinopril prior to pregnacy 2013-was d/c and has not been restarted on anything  . Lumbar disc disease   . Obesity (BMI 30-39.9)   . Paroxysmal atrial fibrillation ([place]) 11/29/2016   CHA2DS2VASC score 2    onset 11/29/2016  . Seizures ([place])    school age- primary. x1 only , related to febrile episode.     Family History  Problem Relation Age of Onset    . Hypertension Mother   . Heart disease Mother   . Arthritis Mother   . Hyperlipidemia Mother   . Hypertension Maternal Grandmother   . Heart disease Maternal Grandmother   . Arthritis Maternal Grandmother   . Hypertension Maternal Grandfather   . Heart disease Maternal Grandfather   . Anesthesia problems Neg Hx   . Other Neg Hx     Past Surgical History:  Procedure Laterality Date  . CERVICAL LAMINECTOMY  01/2010   C6 metal bracing   . TUBAL LIGATION     Social History   Occupational History  . Not on file  Tobacco Use  . Smoking status: Former Smoker    Packs/day: 1.00    Years: 20.00    Pack years: 20.00    Types: Cigarettes    Last attempt to quit: 12/28/2011    Years since quitting: 5.8  . Smokeless tobacco: Never Used  Substance and Sexual Activity  . Alcohol use: No  . Drug use: No  . Sexual activity: Yes    Birth control/protection: Surgical    Comment: tubal at 6 weeks

## 2017-10-27 ENCOUNTER — Ambulatory Visit (INDEPENDENT_AMBULATORY_CARE_PROVIDER_SITE_OTHER): Payer: Self-pay | Admitting: Orthopaedic Surgery

## 2017-11-24 ENCOUNTER — Ambulatory Visit (INDEPENDENT_AMBULATORY_CARE_PROVIDER_SITE_OTHER): Payer: Self-pay | Admitting: Orthopaedic Surgery

## 2017-12-01 ENCOUNTER — Ambulatory Visit (INDEPENDENT_AMBULATORY_CARE_PROVIDER_SITE_OTHER): Payer: Medicaid Other | Admitting: Orthopaedic Surgery

## 2017-12-01 ENCOUNTER — Encounter (INDEPENDENT_AMBULATORY_CARE_PROVIDER_SITE_OTHER): Payer: Self-pay | Admitting: Orthopaedic Surgery

## 2017-12-01 ENCOUNTER — Ambulatory Visit (INDEPENDENT_AMBULATORY_CARE_PROVIDER_SITE_OTHER): Payer: Medicaid Other

## 2017-12-01 VITALS — BP 182/101 | HR 88 | Ht 63.0 in | Wt 230.0 lb

## 2017-12-01 DIAGNOSIS — Z981 Arthrodesis status: Secondary | ICD-10-CM | POA: Diagnosis not present

## 2017-12-01 NOTE — Progress Notes (Signed)
Office Visit Note   Patient: Darlene Black           Date of Birth: [birth year]           MRN: [id number] Visit Date: 12/01/2017              Requested by: Vicenta Aly, [place], [place] [id number] PCP: Vicenta Aly, FNP   Assessment & Plan: Visit Diagnoses:  1. S/P lumbar spinal fusion     Plan: Patient is very happy with the surgical result.  She wants to resume work on 12/27/2017, work slip given.  She is off pain medication and preoperative pain is gone.  I will check her back again on a as needed basis.  X-rays were reviewed which shows good position and alignment and progressive bony incorporation.  Follow-Up Instructions: Return if symptoms worsen or fail to improve.   Orders:  Orders Placed This Encounter  Procedures  . XR Lumbar Spine 2-3 Views   No orders of the defined types were placed in this encounter.     Procedures: No procedures performed   Clinical Data: No additional findings.   Subjective: Chief Complaint  Patient presents with  . Lower Back - Routine Post Op    HPI 41 year old female returns post L5-S1 fusion she has been amatory is gotten good relief for her back and leg pain.  Incision is well-healed she is very happy with the surgical result and requests work note for resuming work on 01/01/2018.  Review of Systems updated unchanged from her October surgery, other than as mentioned in HPI.   Objective: Vital Signs: BP (!) 182/101   Pulse 88   Ht 5\' 3"  (1.6 m)   Wt 230 lb (104.3 kg)   BMI 40.74 kg/m   Physical Exam  Constitutional: She is oriented to person, place, and time. She appears well-developed.  HENT:  Head: Normocephalic.  Right Ear: External ear normal.  Left Ear: External ear normal.  Eyes: Pupils are equal, round, and reactive to light.  Neck: No tracheal deviation present. No thyromegaly present.  Cardiovascular: Normal rate.  Pulmonary/Chest: Effort normal.  Abdominal: Soft.   Neurological: She is alert and oriented to person, place, and time.  Skin: Skin is warm and dry.  Psychiatric: She has a normal mood and affect. Her behavior is normal.    Ortho Exam patient is able heel and toe walk.  Good sensation to her foot.  She gets from sitting to standing comfortably.  No weakness in the lower extremities.  Specialty Comments:  No specialty comments available.  Imaging: Xr Lumbar Spine 2-3 Views  Result Date: 12/01/2017 AP lateral lumbar x-ray shows L5-S1 fusion interbody cage good position of screws cage restoration of disc space height. Impression: Satisfactory postop single level L5-S1 fusion with cage.    PMFS History: Patient Active Problem List   Diagnosis Date Noted  . S/P lumbar spinal fusion 12/01/2017  . Paroxysmal atrial fibrillation ([place]) 11/29/2016  . Hypertension 11/29/2016  . Current use of long term anticoagulation 11/29/2016  . Atrial fibrillation with rapid ventricular response ([place]) 11/29/2016  . Obesity (BMI 30-39.9)   . Lumbar disc disease    Past Medical History:  Diagnosis Date  . Arthritis    lumbar,   . Fibroid   . History of kidney stones    + told that she had calculi, unsure of whether she has passed them    . Hypertension    had  been on lisinopril prior to pregnacy 2013-was d/c and has not been restarted on anything  . Lumbar disc disease   . Obesity (BMI 30-39.9)   . Paroxysmal atrial fibrillation ([place]) 11/29/2016   CHA2DS2VASC score 2    onset 11/29/2016  . Seizures ([place])    school age- primary. x1 only , related to febrile episode.     Family History  Problem Relation Age of Onset  . Hypertension Mother   . Heart disease Mother   . Arthritis Mother   . Hyperlipidemia Mother   . Hypertension Maternal Grandmother   . Heart disease Maternal Grandmother   . Arthritis Maternal Grandmother   . Hypertension Maternal Grandfather   . Heart disease Maternal Grandfather   . Anesthesia problems Neg Hx   . Other  Neg Hx     Past Surgical History:  Procedure Laterality Date  . CERVICAL LAMINECTOMY  01/2010   C6 metal bracing   . CESAREAN SECTION  10/10/2012   Procedure: CESAREAN SECTION;x1  Surgeon: Frederico Hamman, MD;  Location: [facility];  Service: Obstetrics;  Laterality: N/A;  . DILITATION & CURRETTAGE/HYSTROSCOPY WITH HYDROTHERMAL ABLATION N/A 10/16/2015   Procedure: DILATATION & CURETTAGE/HYSTEROSCOPY WITH HYDROTHERMAL ABLATION;  Surgeon: Frederico Hamman, MD;  Location: [facility];  Service: Gynecology;  Laterality: N/A;  . LAPAROSCOPIC TUBAL LIGATION  01/18/2013   Procedure: LAPAROSCOPIC TUBAL LIGATION;  Surgeon: Frederico Hamman, MD;  Location: [facility];  Service: Gynecology;  Laterality: Bilateral;  . TUBAL LIGATION     Social History   Occupational History  . Not on file  Tobacco Use  . Smoking status: Former Smoker    Packs/day: 1.00    Years: 20.00    Pack years: 20.00    Types: Cigarettes    Last attempt to quit: 12/28/2011    Years since quitting: 5.9  . Smokeless tobacco: Never Used  Substance and Sexual Activity  . Alcohol use: No  . Drug use: No  . Sexual activity: Yes    Birth control/protection: Surgical    Comment: tubal at 6 weeks

## 2018-01-07 DIAGNOSIS — D72819 Decreased white blood cell count, unspecified: Secondary | ICD-10-CM | POA: Insufficient documentation

## 2018-01-07 DIAGNOSIS — D649 Anemia, unspecified: Secondary | ICD-10-CM | POA: Insufficient documentation

## 2018-01-17 ENCOUNTER — Ambulatory Visit
Admission: RE | Admit: 2018-01-17 | Discharge: 2018-01-17 | Disposition: A | Discharge status: Home / Self Care | Payer: Medicaid Other | Source: Ambulatory Visit | Attending: Physician Assistant | Admitting: Physician Assistant

## 2018-01-17 ENCOUNTER — Other Ambulatory Visit: Payer: Self-pay | Admitting: Physician Assistant

## 2018-01-17 ENCOUNTER — Ambulatory Visit
Admission: RE | Admit: 2018-01-17 | Discharge: 2018-01-17 | Disposition: A | Discharge status: Home / Self Care | Payer: Managed Care, Other (non HMO) | Source: Ambulatory Visit | Attending: Physician Assistant | Admitting: Physician Assistant

## 2018-01-17 DIAGNOSIS — R928 Other abnormal and inconclusive findings on diagnostic imaging of breast: Secondary | ICD-10-CM

## 2018-01-17 DIAGNOSIS — R599 Enlarged lymph nodes, unspecified: Secondary | ICD-10-CM

## 2018-01-19 ENCOUNTER — Other Ambulatory Visit: Payer: Self-pay | Admitting: Physician Assistant

## 2018-01-19 ENCOUNTER — Ambulatory Visit
Admission: RE | Admit: 2018-01-19 | Discharge: 2018-01-19 | Disposition: A | Discharge status: Home / Self Care | Payer: Managed Care, Other (non HMO) | Source: Ambulatory Visit | Attending: Physician Assistant | Admitting: Physician Assistant

## 2018-01-19 ENCOUNTER — Other Ambulatory Visit (HOSPITAL_COMMUNITY)
Admission: RE | Admit: 2018-01-19 | Discharge: 2018-01-19 | Disposition: A | Discharge status: Home / Self Care | Payer: Managed Care, Other (non HMO) | Source: Ambulatory Visit | Attending: Diagnostic Radiology | Admitting: Diagnostic Radiology

## 2018-01-19 DIAGNOSIS — R599 Enlarged lymph nodes, unspecified: Secondary | ICD-10-CM

## 2018-03-07 DIAGNOSIS — D582 Other hemoglobinopathies: Secondary | ICD-10-CM | POA: Insufficient documentation

## 2018-06-08 DIAGNOSIS — R590 Localized enlarged lymph nodes: Secondary | ICD-10-CM | POA: Insufficient documentation

## 2018-06-24 DIAGNOSIS — R600 Localized edema: Secondary | ICD-10-CM | POA: Insufficient documentation

## 2019-01-04 IMAGING — MG DIGITAL SCREENING BILATERAL MAMMOGRAM WITH CAD
4 series · 4 of 4 positions shown · non-contrast
Comparison: None.

CLINICAL DATA: Screening.

EXAM:
DIGITAL SCREENING BILATERAL MAMMOGRAM WITH CAD

[R MLO]
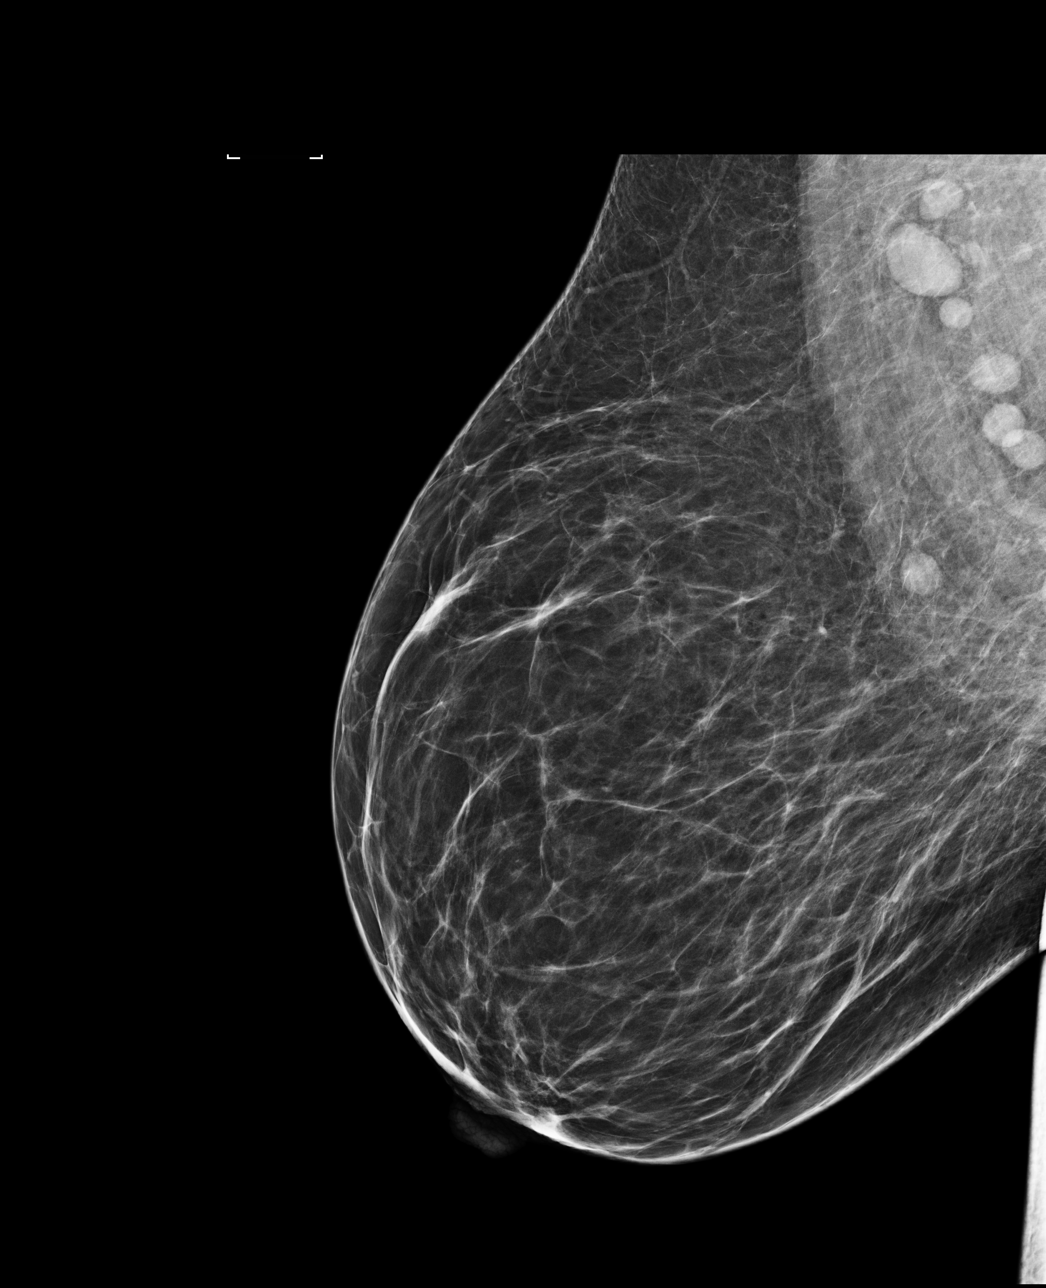

[L CC]
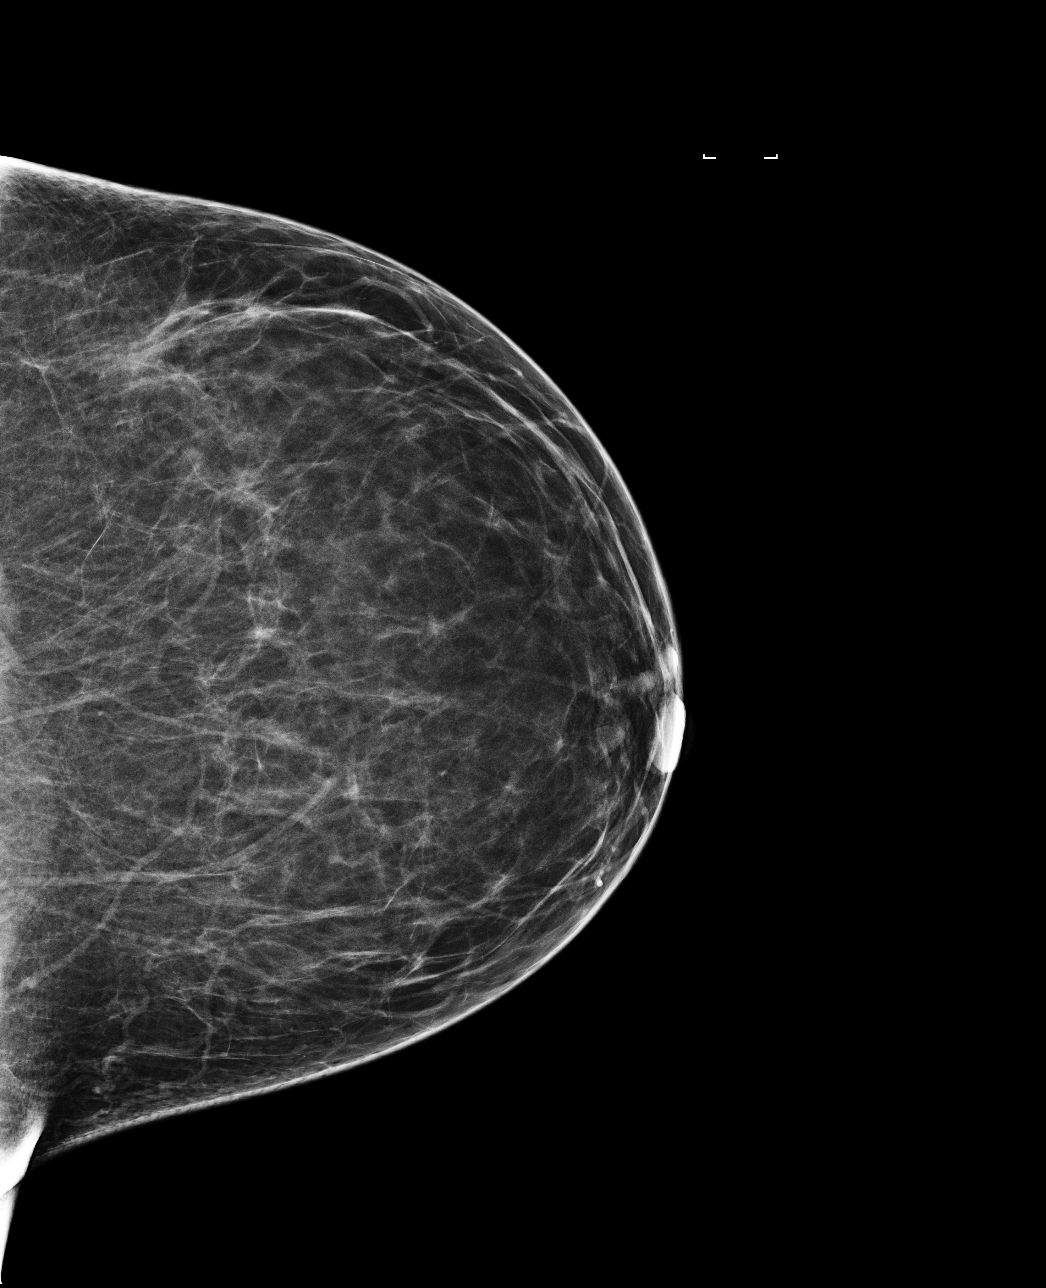

[L MLO]
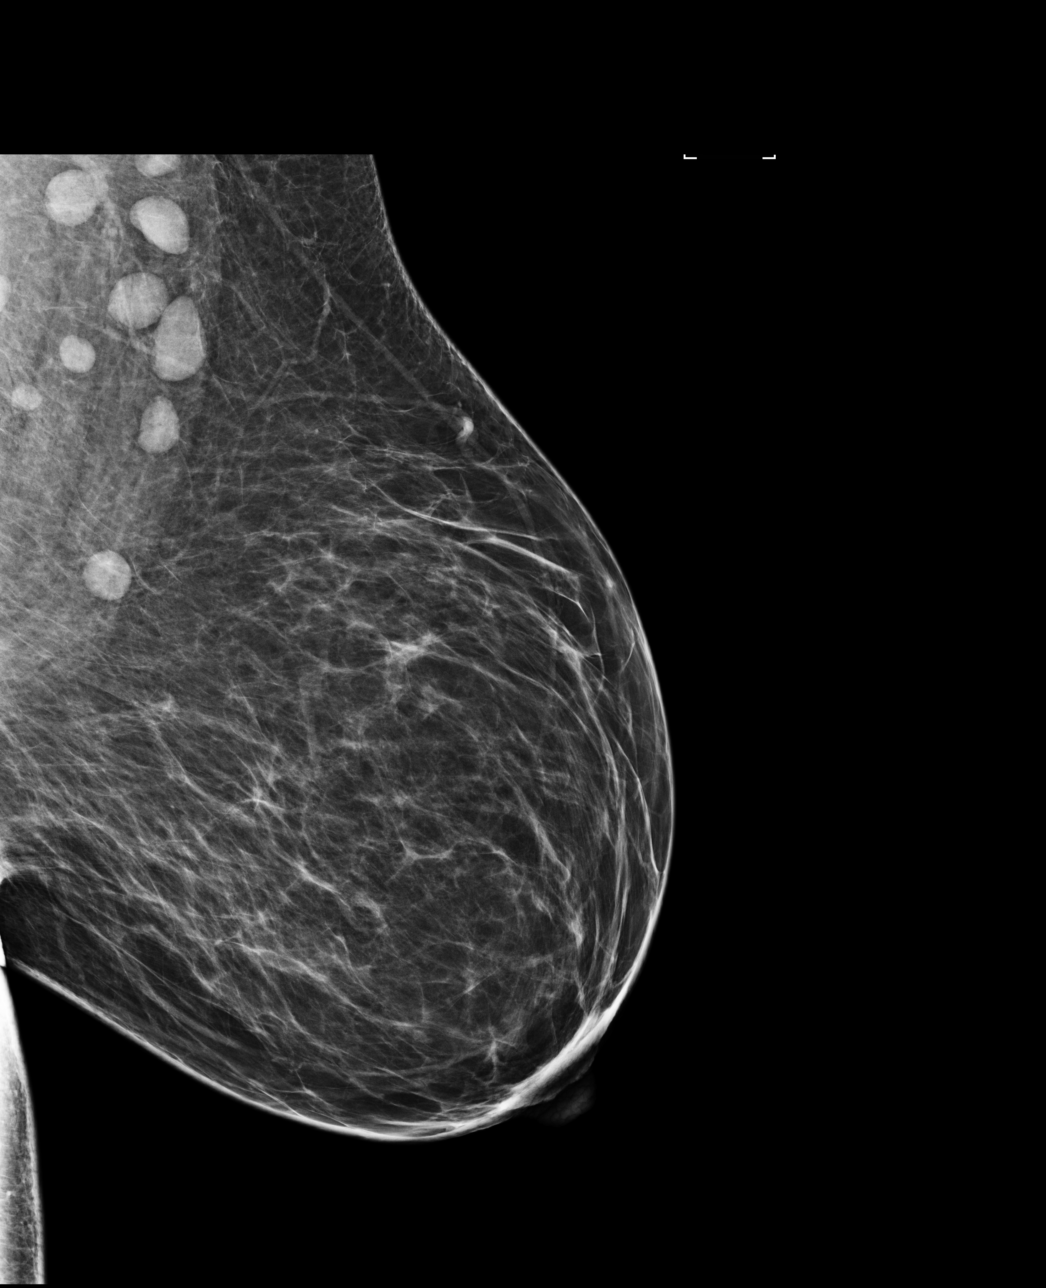

[R CC]
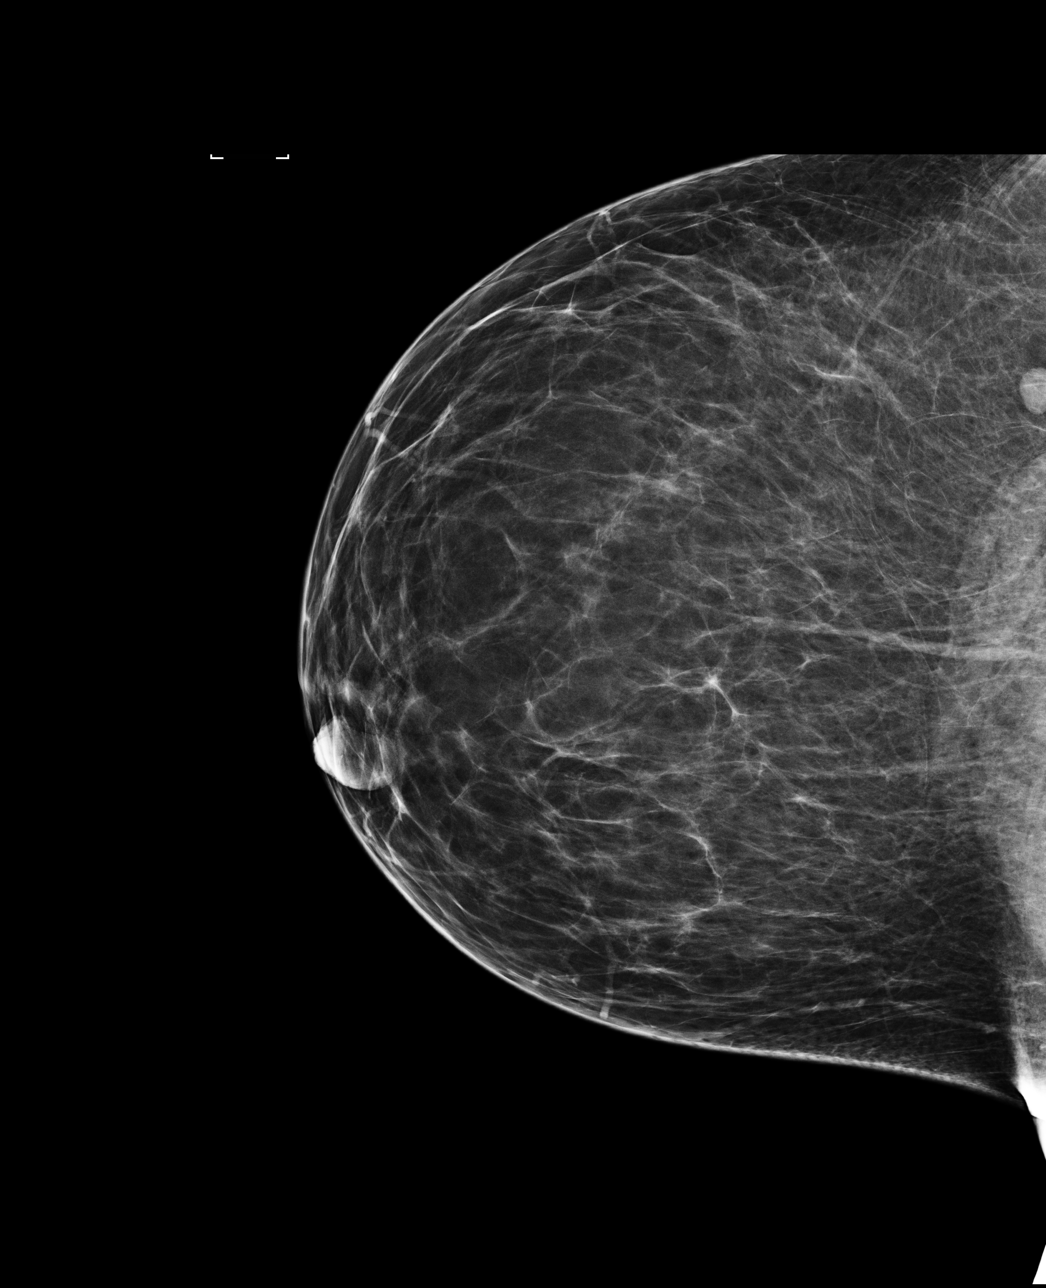

[4 of 4 positions shown; findings below may reference images not displayed]

ACR Breast Density Category b: There are scattered areas of
fibroglandular density.
FINDINGS: There are possible enlarged lymph nodes in the bilateral axilla. No
mammographic abnormalities are identified in the breasts
bilaterally.

Images were processed with CAD.
IMPRESSION: Further evaluation is suggested for possible right axillary
lymphadenopathy.

Further evaluation is suggested for possible left axillary
lymphadenopathy.

RECOMMENDATION:
Diagnostic mammogram and possibly ultrasound of both axilla.
(Code:[id number])

The patient will be contacted regarding the findings, and additional
imaging will be scheduled.

BI-RADS CATEGORY  0: Incomplete. Need additional imaging evaluation
and/or prior mammograms for comparison.

## 2019-05-24 ENCOUNTER — Ambulatory Visit: Payer: Managed Care, Other (non HMO) | Admitting: Orthopaedic Surgery

## 2019-09-04 ENCOUNTER — Encounter: Payer: Self-pay | Admitting: Family Medicine

## 2019-09-04 ENCOUNTER — Ambulatory Visit: Payer: Self-pay

## 2019-09-04 ENCOUNTER — Ambulatory Visit (INDEPENDENT_AMBULATORY_CARE_PROVIDER_SITE_OTHER): Payer: Managed Care, Other (non HMO) | Admitting: Family Medicine

## 2019-09-04 DIAGNOSIS — M5442 Lumbago with sciatica, left side: Secondary | ICD-10-CM

## 2019-09-04 DIAGNOSIS — M25552 Pain in left hip: Secondary | ICD-10-CM | POA: Diagnosis not present

## 2019-09-04 NOTE — Progress Notes (Signed)
I saw and examined the patient with Dr. Mayer Masker and agree with assessment and plan as outlined.  X-Rays seem to show an inferior pubic ramus fracture on the left, non-displaced.  Very tender to palpation there.  Will modify activities, oow for 6 weeks (works at [employer]), recheck with AP pelvis x-ray in 3 weeks.  Lumbar fusion looks stable, no spine fracture seen.

## 2019-09-04 NOTE — Progress Notes (Signed)
Darlene Black - 43 y.o. female MRN [id number]  Date of birth: December 25, 1975  Office Visit Note: Visit Date: 09/04/2019 PCP: Vicenta Aly, FNP Referred by: Vicenta Aly, FNP  Subjective: Chief Complaint  Patient presents with  . Lower Back - Pain    Fell onto her back 08/30/2019 and felt a pop in the left groin. Leg "feels like it will give way." Ambulating with a cane. Saw her PCP on 08/31/19. Numbness in hip to foot.  . Left Hip - Pain   HPI: Darlene Black is a 43 y.o. female who comes in today with acute left hip pain.  She reports that she slipped on   History of L5-S1 spinal fusion. She 9/17 and fell directly onto her back. She felt a loud pop in her left groin and had immediate severe pain in groin. Since that time, she has had difficulty walking and bearing weight on left side; she is walking with a cane. She saw her PCP and was given hydrocodone which has helped her to be able to sleep. At baseline, she has had numbness and tingling down left leg for the past 6-7 months. She reports relative weakness on left side. No change in bowel or bladder function.   She is currently wearing a Holter monitor for atrial fibrillation and was recently diagnosed with Sjogren's syndrome, started on Plaquenil.   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   ROS: reduced appetite, otherwise per HPI    Assessment & Plan: Visit Diagnoses:  1. Acute left-sided low back pain with left-sided sciatica   2. Pain of left hip joint   Pelvic x-ray concerning for left pubis ramus fracture. TTP at L pubic area and pain with pelvic compression.    Plan:  - optimize healing with 4000 units vitamin D daily - return in 3 weeks for repeat x-rays - wrote note for work excuse for 6 weeks  Meds & Orders: No orders of the defined types were placed in this encounter.   Orders Placed This Encounter  Procedures  . XR Lumbar Spine 2-3 Views  . XR HIP UNILAT W OR W/O  PELVIS 2-3 VIEWS LEFT    Follow-up: Return in about 3 weeks (around 09/25/2019).   Procedures: No procedures performed  No notes on file   Clinical History: No specialty comments available.   She reports that she quit smoking about 7 years ago. Her smoking use included cigarettes. She has a 20.00 pack-year smoking history. She has never used smokeless tobacco. No results for input(s): HGBA1C, LABURIC in the last 8760 hours.  Objective:  VS:  HT:    WT:   BMI:     BP:   HR: bpm  TEMP: ( )  RESP:  Physical Exam  PHYSICAL EXAM: Gen: NAD, alert, cooperative with exam, well-appearing, walking with cane  HEENT: clear conjunctiva,  CV:  no edema, capillary refill brisk, normal rate Resp: non-labored Skin: no rashes, normal turgor  Neuro: no gross deficits.  Psych:  alert and oriented  Ortho Exam  Lumbar spine: - Inspection: surgical scar over L5-S1, no swelling or ecchymosis - Palpation: TTP over the L5-S1, TTP over lower back diffusely, including SI joints b/l - ROM: reduced ROM of the lumbar spine- pain with flexion worse than extension  - Strength: 4/5 strength of LLE (limited by pain), 5/5 strength in RLE; reduced sensation in LLE (throughout L4-S1). Normal sensation in L4-S1 nerve root distribution on  R  - Neuro: sensation intact in the L4-S1 nerve root distribution b/l, 2+ L4 and S1 reflexes - Special testing: Negative straight leg raise, Negative FABER,   Left Hips/pelvis TTP over pubis, slightly left of pubis symphysis. Pain with pelvic compression  Pain at groin with FABER and FADIR as well as resisted adduction and flexion of leg  Gait: leaning on cane, swings left leg through   Imaging: Lumbar x-ray- no instability with flexion and extension films Left pubic ramus fracture  Past Medical/Family/Surgical/Social History: Medications & Allergies reviewed per EMR, new medications updated. Patient Active Problem List   Diagnosis Date Noted  . S/P lumbar spinal  fusion 12/01/2017  . Paroxysmal atrial fibrillation ([place]) 11/29/2016  . Hypertension 11/29/2016  . Current use of long term anticoagulation 11/29/2016  . Atrial fibrillation with rapid ventricular response ([place]) 11/29/2016  . Obesity (BMI 30-39.9)   . Lumbar disc disease    Past Medical History:  Diagnosis Date  . Arthritis    lumbar,   . Fibroid   . History of kidney stones    + told that she had calculi, unsure of whether she has passed them    . Hypertension    had been on lisinopril prior to pregnacy 2013-was d/c and has not been restarted on anything  . Lumbar disc disease   . Obesity (BMI 30-39.9)   . Paroxysmal atrial fibrillation ([place]) 11/29/2016   CHA2DS2VASC score 2    onset 11/29/2016  . Seizures ([place])    school age- primary. x1 only , related to febrile episode.    Family History  Problem Relation Age of Onset  . Hypertension Mother   . Heart disease Mother   . Arthritis Mother   . Hyperlipidemia Mother   . Hypertension Maternal Grandmother   . Heart disease Maternal Grandmother   . Arthritis Maternal Grandmother   . Hypertension Maternal Grandfather   . Heart disease Maternal Grandfather   . Anesthesia problems Neg Hx   . Other Neg Hx    Past Surgical History:  Procedure Laterality Date  . CERVICAL LAMINECTOMY  01/2010   C6 metal bracing   . CESAREAN SECTION  10/10/2012   Procedure: CESAREAN SECTION;x1  Surgeon: Frederico Hamman, MD;  Location: [facility];  Service: Obstetrics;  Laterality: N/A;  . DILITATION & CURRETTAGE/HYSTROSCOPY WITH HYDROTHERMAL ABLATION N/A 10/16/2015   Procedure: DILATATION & CURETTAGE/HYSTEROSCOPY WITH HYDROTHERMAL ABLATION;  Surgeon: Frederico Hamman, MD;  Location: [facility];  Service: Gynecology;  Laterality: N/A;  . LAPAROSCOPIC TUBAL LIGATION  01/18/2013   Procedure: LAPAROSCOPIC TUBAL LIGATION;  Surgeon: Frederico Hamman, MD;  Location: [facility];  Service: Gynecology;  Laterality: Bilateral;  . TUBAL LIGATION     Social History    Occupational History  . Not on file  Tobacco Use  . Smoking status: Former Smoker    Packs/day: 1.00    Years: 20.00    Pack years: 20.00    Types: Cigarettes    Quit date: 12/28/2011    Years since quitting: 7.6  . Smokeless tobacco: Never Used  Substance and Sexual Activity  . Alcohol use: No  . Drug use: No  . Sexual activity: Yes    Birth control/protection: Surgical    Comment: tubal at 6 weeks

## 2019-09-25 ENCOUNTER — Encounter: Payer: Self-pay | Admitting: Family Medicine

## 2019-09-25 ENCOUNTER — Ambulatory Visit (INDEPENDENT_AMBULATORY_CARE_PROVIDER_SITE_OTHER): Payer: Managed Care, Other (non HMO) | Admitting: Family Medicine

## 2019-09-25 ENCOUNTER — Other Ambulatory Visit: Payer: Self-pay

## 2019-09-25 ENCOUNTER — Ambulatory Visit: Payer: Self-pay

## 2019-09-25 DIAGNOSIS — R102 Pelvic and perineal pain: Secondary | ICD-10-CM | POA: Diagnosis not present

## 2019-09-25 DIAGNOSIS — M546 Pain in thoracic spine: Secondary | ICD-10-CM | POA: Diagnosis not present

## 2019-09-25 DIAGNOSIS — M5442 Lumbago with sciatica, left side: Secondary | ICD-10-CM

## 2019-09-25 MED ORDER — GABAPENTIN 300 MG PO CAPS: ORAL_CAPSULE | ORAL | 3 refills | Status: DC

## 2019-09-25 NOTE — Progress Notes (Signed)
Darlene CICCOTELLI - 43 y.o. female MRN [id number]  Date of birth: August 12, 1976  Office Visit Note: Visit Date: 09/25/2019 PCP: Vicenta Aly, FNP Referred by: Vicenta Aly, FNP  Subjective: Chief Complaint  Patient presents with  . Lower Back - Pain, Follow-up    Continues to have pain - cannot sleep at night. Left leg stays numb.  . Middle Back - Pain    Has had pain in the middle of the back x years - "hurts all the time." H/o neck surgery x 2. Numbness in both thumbs.   HPI: Darlene Black is a 43 y.o. female who comes in today for follow up of pubic ramus fracture, as well as back pain after fall on 9/17.  She was last seen on 9/21. She reports that since that visit, her lower back pain has persisted. She has numbness and pain down back of left leg and into foot. She is having difficulty standing for prolonged periods of time secondary to pain. This has been ongoing for > 7 months.  She also reports mid back pain that is present at all times. She has not had imaging of thoracic spine before.  She is still having difficulty walking. She has misplaced her cane and is here limping but no walking support.  Recently diagnosed with Sjogren's syndrome, on Plaquenil.   History of L5-S1 fusion by Dr. Lorin Mercy in 2018.   ROS Otherwise per HPI.  Assessment & Plan: Visit Diagnoses:  1. Acute left-sided low back pain with left-sided sciatica   2. Pain in thoracic spine   3. Pelvic pain     Plan:  Lumbar pain with left sided sciatica: s/p lumbar fusion in 2018. Will obtain MRI of lumbar spine given worsening symptoms and start gabapentin for neuropathic pain  Thoracic spinal pain: x-rays showing degenerative changes in spine, no acute fracture seen  Pelvic pain: fracture of pubic ramus is stable from prior films  Provided prescription for walker  Meds & Orders:  Meds ordered this encounter  Medications  . gabapentin (NEURONTIN) 300 MG capsule    Sig: 1 PO q HS, may  increase to 1 PO TID if needed.    Dispense:  90 capsule    Refill:  3    Orders Placed This Encounter  Procedures  . XR Pelvis 1-2 Views  . XR Thoracic Spine 2 View  . MR Lumbar Spine w/o contrast    Follow-up: 6 weeks  Procedures: No procedures performed  No notes on file   Clinical History: No specialty comments available.   She reports that she quit smoking about 7 years ago. Her smoking use included cigarettes. She has a 20.00 pack-year smoking history. She has never used smokeless tobacco. No results for input(s): HGBA1C, LABURIC in the last 8760 hours.  Objective:  VS:  HT:    WT:   BMI:     BP:   HR: bpm  TEMP: ( )  RESP:  Physical Exam  PHYSICAL EXAM: Gen: NAD, alert, cooperative with exam, well-appearing HEENT: clear conjunctiva,  CV:  no edema, capillary refill brisk, normal rate Resp: non-labored Skin: no rashes, normal turgor  Neuro: no gross deficits.  Psych:  alert and oriented  SpineL Inspection: surgical scar over L5-S1, no swelling or ecchymosis - Palpation: TTP over T8-T12 as well as L5-S1.  - ROM: limited movement of of the lumbar spine due to pain pain - Strength: 5/5 strength of lower extremity in L4-S1 nerve root distributions b/l; normal  gait - Neuro: decreased sensation along entire left leg. 1+ L4 and S1 reflexes - Special testing: Positive straight leg raise on left  Pain with pelvic compression, TTP over pubis  Gait: swings left leg through, leans to right  Imaging: Left pubis ramus non-displaced fracture, stable from prior films Thoracic spine: degenerative changes, no fracture  Past Medical/Family/Surgical/Social History: Medications & Allergies reviewed per EMR, new medications updated. Patient Active Problem List   Diagnosis Date Noted  . S/P lumbar spinal fusion 12/01/2017  . Paroxysmal atrial fibrillation ([place]) 11/29/2016  . Hypertension 11/29/2016  . Current use of long term anticoagulation 11/29/2016  . Atrial  fibrillation with rapid ventricular response ([place]) 11/29/2016  . Obesity (BMI 30-39.9)   . Lumbar disc disease    Past Medical History:  Diagnosis Date  . Arthritis    lumbar,   . Fibroid   . History of kidney stones    + told that she had calculi, unsure of whether she has passed them    . Hypertension    had been on lisinopril prior to pregnacy 2013-was d/c and has not been restarted on anything  . Lumbar disc disease   . Obesity (BMI 30-39.9)   . Paroxysmal atrial fibrillation ([place]) 11/29/2016   CHA2DS2VASC score 2    onset 11/29/2016  . Seizures ([place])    school age- primary. x1 only , related to febrile episode.    Family History  Problem Relation Age of Onset  . Hypertension Mother   . Heart disease Mother   . Arthritis Mother   . Hyperlipidemia Mother   . Hypertension Maternal Grandmother   . Heart disease Maternal Grandmother   . Arthritis Maternal Grandmother   . Hypertension Maternal Grandfather   . Heart disease Maternal Grandfather   . Anesthesia problems Neg Hx   . Other Neg Hx    Past Surgical History:  Procedure Laterality Date  . CERVICAL LAMINECTOMY  01/2010   C6 metal bracing   . CESAREAN SECTION  10/10/2012   Procedure: CESAREAN SECTION;x1  Surgeon: Frederico Hamman, MD;  Location: [facility];  Service: Obstetrics;  Laterality: N/A;  . DILITATION & CURRETTAGE/HYSTROSCOPY WITH HYDROTHERMAL ABLATION N/A 10/16/2015   Procedure: DILATATION & CURETTAGE/HYSTEROSCOPY WITH HYDROTHERMAL ABLATION;  Surgeon: Frederico Hamman, MD;  Location: [facility];  Service: Gynecology;  Laterality: N/A;  . LAPAROSCOPIC TUBAL LIGATION  01/18/2013   Procedure: LAPAROSCOPIC TUBAL LIGATION;  Surgeon: Frederico Hamman, MD;  Location: [facility];  Service: Gynecology;  Laterality: Bilateral;  . TUBAL LIGATION     Social History   Occupational History  . Not on file  Tobacco Use  . Smoking status: Former Smoker    Packs/day: 1.00    Years: 20.00    Pack years: 20.00    Types:  Cigarettes    Quit date: 12/28/2011    Years since quitting: 7.7  . Smokeless tobacco: Never Used  Substance and Sexual Activity  . Alcohol use: No  . Drug use: No  . Sexual activity: Yes    Birth control/protection: Surgical    Comment: tubal at 6 weeks

## 2019-09-25 NOTE — Progress Notes (Signed)
I saw and examined the patient with Dr. Mayer Masker and agree with assessment and plan as outlined.    Still having a lot of pain after falling.  Continues to have pain in the pubic ramus area, but also on the lateral and posterior hips with left leg sciatica symptoms.  In addition, having chronic thoracic pain.  She is finding it very difficult to stand for long periods and thinks she would benefit from using a walker.  X-rays today of the pelvis did not show any change.  Still question pubic ramus fracture on the left, nondisplaced.  There is degenerative disc disease in the thoracic spine.  We will order a new lumbar MRI scan.  Based on those results, possibly physical therapy.  We will start gabapentin for pain.  See her back in about 6 weeks.  Keep her out of work until then.

## 2019-09-26 ENCOUNTER — Telehealth: Payer: Self-pay | Admitting: Family Medicine

## 2019-09-26 NOTE — Telephone Encounter (Signed)
I advised the patient the forms were faxed this morning for her. The originals will be placed in the mail to her, along with an Rx for a rollator walker (per patient request).

## 2019-09-26 NOTE — Telephone Encounter (Signed)
Faxed 9/12 & 10/12 ov notes along with forms to [employer] [phone]

## 2019-09-26 NOTE — Telephone Encounter (Signed)
Patient requested that you mail the information to her that you were getting for her, she will not have time to come and get it.  CB#[phone].  Thank you.

## 2019-10-09 ENCOUNTER — Other Ambulatory Visit: Payer: Self-pay | Admitting: Family Medicine

## 2019-10-13 ENCOUNTER — Telehealth: Payer: Self-pay | Admitting: Family Medicine

## 2019-10-13 ENCOUNTER — Other Ambulatory Visit: Payer: Self-pay

## 2019-10-13 ENCOUNTER — Ambulatory Visit
Admission: RE | Admit: 2019-10-13 | Discharge: 2019-10-13 | Disposition: A | Discharge status: Home / Self Care | Payer: 59 | Source: Ambulatory Visit | Attending: Family Medicine | Admitting: Family Medicine

## 2019-10-13 DIAGNOSIS — M5442 Lumbago with sciatica, left side: Secondary | ICD-10-CM

## 2019-10-13 DIAGNOSIS — R102 Pelvic and perineal pain: Secondary | ICD-10-CM

## 2019-10-13 NOTE — Telephone Encounter (Signed)
Lumbar MRI does not show a new disc herniation.    Will make referral for physical therapy.

## 2019-10-20 DIAGNOSIS — M3500 Sicca syndrome, unspecified: Secondary | ICD-10-CM | POA: Insufficient documentation

## 2019-10-27 DIAGNOSIS — G4733 Obstructive sleep apnea (adult) (pediatric): Secondary | ICD-10-CM | POA: Insufficient documentation

## 2019-11-07 ENCOUNTER — Ambulatory Visit (INDEPENDENT_AMBULATORY_CARE_PROVIDER_SITE_OTHER): Payer: Managed Care, Other (non HMO) | Admitting: Orthopaedic Surgery

## 2019-11-07 ENCOUNTER — Other Ambulatory Visit: Payer: Self-pay | Admitting: Physician Assistant

## 2019-11-07 ENCOUNTER — Encounter: Payer: Self-pay | Admitting: Orthopaedic Surgery

## 2019-11-07 ENCOUNTER — Other Ambulatory Visit: Payer: Self-pay

## 2019-11-07 DIAGNOSIS — Z981 Arthrodesis status: Secondary | ICD-10-CM | POA: Diagnosis not present

## 2019-11-07 DIAGNOSIS — R590 Localized enlarged lymph nodes: Secondary | ICD-10-CM

## 2019-11-07 NOTE — Progress Notes (Signed)
Office Visit Note   Patient: Darlene Black           Date of Birth: [birth year]           MRN: [id number] Visit Date: 11/07/2019              Requested by: Vicenta Aly, [address],  [place] [address] PCP: Vicenta Aly, FNP   Assessment & Plan: Visit Diagnoses:  1. S/P lumbar spinal fusion     Plan: Patient had a fall with persistent symptoms since 08/30/2019 has been kept out of work by Dr. Dr. Junius Roads.  She has had 2 sets of pelvic x-rays reviewed those with her today I did not see any definite fracture or on repeat images evidence of healed fracture.  X-rays of the single level fusion shows good incorporation of the graft.  She can follow-up with Dr. Junius Roads for continued therapy and continue working on weight loss gradual core strengthening and increased mobility.  MRI scan reviewed today with her and no indication for further surgery at this time.  Follow-Up Instructions: No follow-ups on file.   Orders:  No orders of the defined types were placed in this encounter.  No orders of the defined types were placed in this encounter.     Procedures: No procedures performed   Clinical Data: No additional findings.   Subjective: Chief Complaint  Patient presents with  . Lower Back - Pain    HPI 43 year old female returns has been followed by Dr. Junius Roads with ongoing low back pain.  She fell on 08/30/2019 and has had back pain right lateral leg numbness and tenderness ambulatory with a cane.  She is currently out of work and states she has increased pain after she stands for long period of time or when she walks faster.  She has been diagnosed with Sjogren's syndrome.  She states that modified work is not available.  MRI scan 10/13/2019 lumbar spine showed interbody fusion L5S1.  No areas of high-grade canal stenosis.  Slight disc dehydration desiccation at L3-4 interspace.  Congenital short pedicles as noted previously.  L5-S1 fusion was done by  [place] on 09/15/2017.  Review of Systems 14 point system update unchanged.  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  Long-term use of anticoagulation.  Mild peripheral edema, obesity, L5-S1 lumbar fusion.   Objective: Vital Signs: There were no vitals taken for this visit.  Physical Exam Constitutional:      Appearance: She is well-developed.  HENT:     Head: Normocephalic.     Right Ear: External ear normal.     Left Ear: External ear normal.  Eyes:     Pupils: Pupils are equal, round, and reactive to light.  Neck:     Thyroid: No thyromegaly.     Trachea: No tracheal deviation.  Cardiovascular:     Rate and Rhythm: Normal rate.  Pulmonary:     Effort: Pulmonary effort is normal.  Abdominal:     Palpations: Abdomen is soft.  Skin:    General: Skin is warm and dry.  Neurological:     Mental Status: She is alert and oriented to person, place, and time.  Psychiatric:        Behavior: Behavior normal.     Ortho Exam patient has intact anterior tib EHL gastrocsoleus.  Left ankle jerk is trace 2+ right ankle jerk knee jerk is 2+ and symmetrical.  No lower extremity atrophy.  Specialty Comments:  No specialty  comments available.  Imaging: No results found.   PMFS History: Patient Active Problem List   Diagnosis Date Noted  . Mild peripheral edema 06/24/2018  . Axillary lymphadenopathy 06/08/2018  . 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 Anemia, unspecified 01/07/2018  . Leukopenia 01/07/2018  . S/P lumbar spinal fusion 12/01/2017  . Paroxysmal atrial fibrillation ([place]) 11/29/2016  . Hypertension 11/29/2016  . Current use of long term anticoagulation 11/29/2016  . Atrial fibrillation with rapid ventricular response ([place]) 11/29/2016  . Obesity (BMI 30-39.9)   . Lumbar disc disease   . Adult situational stress disorder 01/27/2016  . Left lumbar radiculopathy 01/27/2016   Past Medical History:  Diagnosis Date  . Arthritis    lumbar,   . Fibroid   . History of  kidney stones    + told that she had calculi, unsure of whether she has passed them    . Hypertension    had been on lisinopril prior to pregnacy 2013-was d/c and has not been restarted on anything  . Lumbar disc disease   . Obesity (BMI 30-39.9)   . Paroxysmal atrial fibrillation ([place]) 11/29/2016   CHA2DS2VASC score 2    onset 11/29/2016  . Seizures ([place])    school age- primary. x1 only , related to febrile episode.     Family History  Problem Relation Age of Onset  . Hypertension Mother   . Heart disease Mother   . Arthritis Mother   . Hyperlipidemia Mother   . Hypertension Maternal Grandmother   . Heart disease Maternal Grandmother   . Arthritis Maternal Grandmother   . Hypertension Maternal Grandfather   . Heart disease Maternal Grandfather   . Anesthesia problems Neg Hx   . Other Neg Hx     Past Surgical History:  Procedure Laterality Date  . CERVICAL LAMINECTOMY  01/2010   C6 metal bracing   . CESAREAN SECTION  10/10/2012   Procedure: CESAREAN SECTION;x1  Surgeon: Frederico Hamman, MD;  Location: [facility];  Service: Obstetrics;  Laterality: N/A;  . DILITATION & CURRETTAGE/HYSTROSCOPY WITH HYDROTHERMAL ABLATION N/A 10/16/2015   Procedure: DILATATION & CURETTAGE/HYSTEROSCOPY WITH HYDROTHERMAL ABLATION;  Surgeon: Frederico Hamman, MD;  Location: [facility];  Service: Gynecology;  Laterality: N/A;  . LAPAROSCOPIC TUBAL LIGATION  01/18/2013   Procedure: LAPAROSCOPIC TUBAL LIGATION;  Surgeon: Frederico Hamman, MD;  Location: [facility];  Service: Gynecology;  Laterality: Bilateral;  . TUBAL LIGATION     Social History   Occupational History  . Not on file  Tobacco Use  . Smoking status: Former Smoker    Packs/day: 1.00    Years: 20.00    Pack years: 20.00    Types: Cigarettes    Quit date: 12/28/2011    Years since quitting: 7.8  . Smokeless tobacco: Never Used  Substance and Sexual Activity  . Alcohol use: No  . Drug use: No  . Sexual activity: Yes    Birth  control/protection: Surgical    Comment: tubal at 6 weeks

## 2019-11-08 ENCOUNTER — Ambulatory Visit (INDEPENDENT_AMBULATORY_CARE_PROVIDER_SITE_OTHER): Payer: Managed Care, Other (non HMO) | Admitting: Family Medicine

## 2019-11-08 ENCOUNTER — Encounter: Payer: Self-pay | Admitting: Family Medicine

## 2019-11-08 ENCOUNTER — Ambulatory Visit: Payer: Self-pay

## 2019-11-08 DIAGNOSIS — R102 Pelvic and perineal pain: Secondary | ICD-10-CM

## 2019-11-08 MED ORDER — CYCLOBENZAPRINE HCL 10 MG PO TABS: 10.0000 mg | ORAL_TABLET | Freq: Three times a day (TID) | ORAL | 1 refills | Status: DC | PRN

## 2019-11-08 NOTE — Progress Notes (Signed)
Office Visit Note   Patient: Darlene Black           Date of Birth: [birth year]           MRN: [id number] Visit Date: 11/08/2019 Requested by: Vicenta Aly, [address],  [place] [address] PCP: Vicenta Aly, FNP  Subjective: Chief Complaint  Patient presents with  . Lower Back - Pain    6 weeks follow up. S/p MRI. Ambulating with cane. No change since last visit. 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    HPI: Is been about 2 months since she fell resulting in low back and pelvis pain.  She is very slowly getting better but not nearly enough to be able to go back to work.  She is not allowed to return to work with any restrictions.  She has pain on the posterior aspect of her pelvis, and sometimes around into the groin area.  Using a cane for support.  She starting to have some tearfulness and increased anxiety, possibly related to her ongoing troubles.               ROS: No bowel or bladder dysfunction.  All other systems were reviewed and are negative.  Objective: Vital Signs: There were no vitals taken for this visit.  Physical Exam:  General:  Alert and oriented, in no acute distress. Pulm:  Breathing unlabored. Psy:  Normal mood, congruent affect.  Low back: She is tender near the SI joints bilaterally.  She has pain with passive hip flexion and internal rotation on both sides but still good range of motion.  Neurologically intact.  Imaging: X-ray pelvis: No definite fracture seen.  The left inferior pubic ramus in question does not show any sign of cortical defect or callus formation.    Assessment & Plan: 1.  2 months status post fall with persistent but slowly improving low back and pelvis pain, possibly due to sacroiliac dysfunction. -Flexeril as needed, physical therapy at [facility].  SI joint injection if pain persist. -Out of work until January 11, follow-up in about 6 weeks.      Procedures: No procedures performed  No notes on file     PMFS History: Patient Active Problem List   Diagnosis Date Noted  . Mild peripheral edema 06/24/2018  . Axillary lymphadenopathy 06/08/2018  . 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 Anemia, unspecified 01/07/2018  . Leukopenia 01/07/2018  . S/P lumbar spinal fusion 12/01/2017  . Paroxysmal atrial fibrillation ([place]) 11/29/2016  . Hypertension 11/29/2016  . Current use of long term anticoagulation 11/29/2016  . Atrial fibrillation with rapid ventricular response ([place]) 11/29/2016  . Obesity (BMI 30-39.9)   . Lumbar disc disease   . Adult situational stress disorder 01/27/2016  . Left lumbar radiculopathy 01/27/2016   Past Medical History:  Diagnosis Date  . Arthritis    lumbar,   . Fibroid   . History of kidney stones    + told that she had calculi, unsure of whether she has passed them    . Hypertension    had been on lisinopril prior to pregnacy 2013-was d/c and has not been restarted on anything  . Lumbar disc disease   . Obesity (BMI 30-39.9)   . Paroxysmal atrial fibrillation ([place]) 11/29/2016   CHA2DS2VASC score 2    onset 11/29/2016  . Seizures ([place])    school age- primary. x1 only , related to  febrile episode.     Family History  Problem Relation Age of Onset  . Hypertension Mother   . Heart disease Mother   . Arthritis Mother   . Hyperlipidemia Mother   . Hypertension Maternal Grandmother   . Heart disease Maternal Grandmother   . Arthritis Maternal Grandmother   . Hypertension Maternal Grandfather   . Heart disease Maternal Grandfather   . Anesthesia problems Neg Hx   . Other Neg Hx     Past Surgical History:  Procedure Laterality Date  . CERVICAL LAMINECTOMY  01/2010   C6 metal bracing   . CESAREAN SECTION  10/10/2012   Procedure: CESAREAN SECTION;x1  Surgeon: Frederico Hamman, MD;  Location: [facility];  Service: Obstetrics;  Laterality: N/A;  . DILITATION & CURRETTAGE/HYSTROSCOPY WITH  HYDROTHERMAL ABLATION N/A 10/16/2015   Procedure: DILATATION & CURETTAGE/HYSTEROSCOPY WITH HYDROTHERMAL ABLATION;  Surgeon: Frederico Hamman, MD;  Location: [facility];  Service: Gynecology;  Laterality: N/A;  . LAPAROSCOPIC TUBAL LIGATION  01/18/2013   Procedure: LAPAROSCOPIC TUBAL LIGATION;  Surgeon: Frederico Hamman, MD;  Location: [facility];  Service: Gynecology;  Laterality: Bilateral;  . TUBAL LIGATION     Social History   Occupational History  . Not on file  Tobacco Use  . Smoking status: Former Smoker    Packs/day: 1.00    Years: 20.00    Pack years: 20.00    Types: Cigarettes    Quit date: 12/28/2011    Years since quitting: 7.8  . Smokeless tobacco: Never Used  Substance and Sexual Activity  . Alcohol use: No  . Drug use: No  . Sexual activity: Yes    Birth control/protection: Surgical    Comment: tubal at 6 weeks

## 2019-11-19 ENCOUNTER — Encounter: Payer: Self-pay | Admitting: Family Medicine

## 2019-11-24 ENCOUNTER — Ambulatory Visit: Payer: 59

## 2019-11-24 ENCOUNTER — Other Ambulatory Visit: Payer: Self-pay

## 2019-11-24 ENCOUNTER — Other Ambulatory Visit: Payer: Self-pay | Admitting: Physician Assistant

## 2019-11-24 ENCOUNTER — Ambulatory Visit
Admission: RE | Admit: 2019-11-24 | Discharge: 2019-11-24 | Disposition: A | Discharge status: Home / Self Care | Payer: 59 | Source: Ambulatory Visit | Attending: Physician Assistant | Admitting: Physician Assistant

## 2019-11-24 DIAGNOSIS — R590 Localized enlarged lymph nodes: Secondary | ICD-10-CM

## 2019-12-20 ENCOUNTER — Other Ambulatory Visit: Payer: Self-pay

## 2019-12-20 ENCOUNTER — Encounter: Payer: Self-pay | Admitting: Family Medicine

## 2019-12-20 ENCOUNTER — Ambulatory Visit (INDEPENDENT_AMBULATORY_CARE_PROVIDER_SITE_OTHER): Payer: Managed Care, Other (non HMO) | Admitting: Family Medicine

## 2019-12-20 VITALS — Ht 62.0 in | Wt 210.0 lb

## 2019-12-20 DIAGNOSIS — M5442 Lumbago with sciatica, left side: Secondary | ICD-10-CM

## 2019-12-20 NOTE — Progress Notes (Signed)
Office Visit Note   Patient: Darlene Black           Date of Birth: 13-Oct-1976           MRN: [id number] Visit Date: 12/20/2019 Requested by: Vicenta Aly, [address] [place] [place],  [place] [address] PCP: Vicenta Aly, FNP  Subjective: Chief Complaint  Patient presents with  . Lower Back - Pain, Follow-up    "I'm doing really good with physical therapy." Going 2 x week. Would like to continue with the PT longer.    HPI: She is here for follow-up left-sided low back pain.  Doing much better with physical therapy.  She is doing home exercises as well.  She is very optimistic that this is going to help and plans to keep going to her therapist intermittently for 6 months to develop good habits and strength.              ROS: No fever or chills.  All other systems were reviewed and are negative.  Objective: Vital Signs: Ht 5\' 2"  (1.575 m)   Wt 210 lb (95.3 kg)   BMI 38.41 kg/m   Physical Exam:  General:  Alert and oriented, in no acute distress. Pulm:  Breathing unlabored. Psy:  Normal mood, congruent affect. Skin: No rash or bruising. Low back: Still slightly tender over both SI joints but range of motion is much better.  Lower extremity strength is normal.  Imaging: None today  Assessment & Plan: 1.  Improved low back pain -Out of work for 6 more weeks, continue with physical therapy.  Possibly return to work at that point if doing well.     Procedures: No procedures performed  No notes on file     PMFS History: Patient Active Problem List   Diagnosis Date Noted  . Obstructive sleep apnea syndrome, moderate 10/27/2019  . Sjogren's syndrome ([place]) 10/20/2019  . Mild peripheral edema 06/24/2018  . Axillary lymphadenopathy 06/08/2018  . 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 Anemia, unspecified 01/07/2018  . Leukopenia 01/07/2018  . S/P lumbar spinal fusion 12/01/2017  . Paroxysmal atrial fibrillation ([place]) 11/29/2016  . Hypertension  11/29/2016  . Current use of long term anticoagulation 11/29/2016  . Atrial fibrillation with rapid ventricular response ([place]) 11/29/2016  . Obesity (BMI 30-39.9)   . Lumbar disc disease   . Adult situational stress disorder 01/27/2016  . Left lumbar radiculopathy 01/27/2016   Past Medical History:  Diagnosis Date  . Arthritis    lumbar,   . Fibroid   . History of kidney stones    + told that she had calculi, unsure of whether she has passed them    . Hypertension    had been on lisinopril prior to pregnacy 2013-was d/c and has not been restarted on anything  . Lumbar disc disease   . Obesity (BMI 30-39.9)   . Paroxysmal atrial fibrillation ([place]) 11/29/2016   CHA2DS2VASC score 2    onset 11/29/2016  . Seizures ([place])    school age- primary. x1 only , related to febrile episode.     Family History  Problem Relation Age of Onset  . Hypertension Mother   . Heart disease Mother   . Arthritis Mother   . Hyperlipidemia Mother   . Hypertension Maternal Grandmother   . Heart disease Maternal Grandmother   . Arthritis Maternal Grandmother   . Hypertension Maternal Grandfather   . Heart disease Maternal Grandfather   . Anesthesia problems Neg  Hx   . Other Neg Hx     Past Surgical History:  Procedure Laterality Date  . CERVICAL LAMINECTOMY  01/2010   C6 metal bracing   . CESAREAN SECTION  10/10/2012   Procedure: CESAREAN SECTION;x1  Surgeon: Frederico Hamman, MD;  Location: [facility];  Service: Obstetrics;  Laterality: N/A;  . DILITATION & CURRETTAGE/HYSTROSCOPY WITH HYDROTHERMAL ABLATION N/A 10/16/2015   Procedure: DILATATION & CURETTAGE/HYSTEROSCOPY WITH HYDROTHERMAL ABLATION;  Surgeon: Frederico Hamman, MD;  Location: [facility];  Service: Gynecology;  Laterality: N/A;  . LAPAROSCOPIC TUBAL LIGATION  01/18/2013   Procedure: LAPAROSCOPIC TUBAL LIGATION;  Surgeon: Frederico Hamman, MD;  Location: [facility];  Service: Gynecology;  Laterality: Bilateral;  . TUBAL LIGATION     Social  History   Occupational History  . Not on file  Tobacco Use  . Smoking status: Former Smoker    Packs/day: 1.00    Years: 20.00    Pack years: 20.00    Types: Cigarettes    Quit date: 12/28/2011    Years since quitting: 7.9  . Smokeless tobacco: Never Used  Substance and Sexual Activity  . Alcohol use: No  . Drug use: No  . Sexual activity: Yes    Birth control/protection: Surgical    Comment: tubal at 6 weeks

## 2020-01-04 ENCOUNTER — Encounter: Payer: Self-pay | Admitting: Family Medicine

## 2020-01-31 ENCOUNTER — Telehealth: Payer: Self-pay | Admitting: Family Medicine

## 2020-01-31 ENCOUNTER — Ambulatory Visit: Payer: Managed Care, Other (non HMO) | Admitting: Family Medicine

## 2020-01-31 ENCOUNTER — Encounter: Payer: Self-pay | Admitting: Family Medicine

## 2020-01-31 NOTE — Telephone Encounter (Signed)
I called and left a message on the patient's voice mail that a new work note has been written through her office visit date of 02/05/20. This has been routed to her through [employer]. It is too late to pick up a signed copy, unfortunately, but hopefully the copy she has will suffice for now. I asked her to send a message through [employer] or call if she needs a signed copy to be faxed somewhere for her.

## 2020-01-31 NOTE — Telephone Encounter (Signed)
Patient called requesting Terri give call back. Patient didn't not leave a reason for call back. Patient phone number is [phone].

## 2020-02-05 ENCOUNTER — Other Ambulatory Visit: Payer: Self-pay

## 2020-02-05 ENCOUNTER — Ambulatory Visit (INDEPENDENT_AMBULATORY_CARE_PROVIDER_SITE_OTHER): Payer: Self-pay | Admitting: Family Medicine

## 2020-02-05 ENCOUNTER — Encounter: Payer: Self-pay | Admitting: Family Medicine

## 2020-02-05 DIAGNOSIS — M5442 Lumbago with sciatica, left side: Secondary | ICD-10-CM

## 2020-02-05 NOTE — Progress Notes (Signed)
Office Visit Note   Patient: Darlene Black           Date of Birth: 1976/11/25           MRN: [id number] Visit Date: 02/05/2020 Requested by: Vicenta Aly, [address] [place],  [place] [address] PCP: Vicenta Aly, FNP  Subjective: Chief Complaint  Patient presents with  . Lower Back - Pain, Follow-up    Got some new shoe inserts yesterday - they have changed her posture. Doing HEP now. Living room is now her gym. Needs paperwork filled out to allow her to return to work.    HPI: She is here for follow-up low back pain.  Since last visit she is doing home exercises and feeling good, she also got some orthotics yesterday which seem to make a big difference for her.  She feels like she is ready to go back to work.  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              ROS:   All other systems were reviewed and are negative.  Objective: Vital Signs: There were no vitals taken for this visit.  Physical Exam:  General:  Alert and oriented, in no acute distress. Pulm:  Breathing unlabored. Psy:  Normal mood, congruent affect.  Low back: She has good mobility today, she walks without a limp.  Still little bit of tenderness in the lower back but manageable.  Imaging: None today  Assessment & Plan: 1.  Improved low back pain -Okay to resume regular work without restriction.  Follow-up as needed.     Procedures: No procedures performed  No notes on file     PMFS History: Patient Active Problem List   Diagnosis Date Noted  . Obstructive sleep apnea syndrome, moderate 10/27/2019  . Sjogren's syndrome ([place]) 10/20/2019  . Mild peripheral edema 06/24/2018  . Axillary lymphadenopathy 06/08/2018  . 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 Anemia, unspecified 01/07/2018  . Leukopenia 01/07/2018  . S/P lumbar spinal fusion 12/01/2017  . Paroxysmal atrial fibrillation ([place]) 11/29/2016  .  Hypertension 11/29/2016  . Current use of long term anticoagulation 11/29/2016  . Atrial fibrillation with rapid ventricular response ([place]) 11/29/2016  . Obesity (BMI 30-39.9)   . Lumbar disc disease   . Adult situational stress disorder 01/27/2016  . Left lumbar radiculopathy 01/27/2016   Past Medical History:  Diagnosis Date  . Arthritis    lumbar,   . Fibroid   . History of kidney stones    + told that she had calculi, unsure of whether she has passed them    . Hypertension    had been on lisinopril prior to pregnacy 2013-was d/c and has not been restarted on anything  . Lumbar disc disease   . Obesity (BMI 30-39.9)   . Paroxysmal atrial fibrillation ([place]) 11/29/2016   CHA2DS2VASC score 2    onset 11/29/2016  . Seizures ([place])    school age- primary. x1 only , related to febrile episode.     Family History  Problem Relation Age of Onset  . Hypertension Mother   . Heart disease Mother   . Arthritis Mother   . Hyperlipidemia Mother   . Hypertension Maternal Grandmother   . Heart disease Maternal Grandmother   . Arthritis Maternal Grandmother   . Hypertension Maternal Grandfather   . Heart disease Maternal Grandfather   . Anesthesia problems Neg  Hx   . Other Neg Hx     Past Surgical History:  Procedure Laterality Date  . CERVICAL LAMINECTOMY  01/2010   C6 metal bracing   . CESAREAN SECTION  10/10/2012   Procedure: CESAREAN SECTION;x1  Surgeon: Frederico Hamman, MD;  Location: [facility];  Service: Obstetrics;  Laterality: N/A;  . DILITATION & CURRETTAGE/HYSTROSCOPY WITH HYDROTHERMAL ABLATION N/A 10/16/2015   Procedure: DILATATION & CURETTAGE/HYSTEROSCOPY WITH HYDROTHERMAL ABLATION;  Surgeon: Frederico Hamman, MD;  Location: [facility];  Service: Gynecology;  Laterality: N/A;  . LAPAROSCOPIC TUBAL LIGATION  01/18/2013   Procedure: LAPAROSCOPIC TUBAL LIGATION;  Surgeon: Frederico Hamman, MD;  Location: [facility];  Service: Gynecology;  Laterality: Bilateral;  . TUBAL LIGATION      Social History   Occupational History  . Not on file  Tobacco Use  . Smoking status: Former Smoker    Packs/day: 1.00    Years: 20.00    Pack years: 20.00    Types: Cigarettes    Quit date: 12/28/2011    Years since quitting: 8.1  . Smokeless tobacco: Never Used  Substance and Sexual Activity  . Alcohol use: No  . Drug use: No  . Sexual activity: Yes    Birth control/protection: Surgical    Comment: tubal at 6 weeks

## 2020-04-15 DIAGNOSIS — F411 Generalized anxiety disorder: Secondary | ICD-10-CM | POA: Insufficient documentation

## 2020-04-15 DIAGNOSIS — N951 Menopausal and female climacteric states: Secondary | ICD-10-CM | POA: Insufficient documentation

## 2020-09-02 DIAGNOSIS — M25562 Pain in left knee: Secondary | ICD-10-CM | POA: Insufficient documentation

## 2020-09-03 DIAGNOSIS — B351 Tinea unguium: Secondary | ICD-10-CM | POA: Insufficient documentation

## 2021-02-28 ENCOUNTER — Other Ambulatory Visit: Payer: Self-pay

## 2021-02-28 ENCOUNTER — Ambulatory Visit (INDEPENDENT_AMBULATORY_CARE_PROVIDER_SITE_OTHER): Payer: 59 | Admitting: Family Medicine

## 2021-02-28 ENCOUNTER — Encounter: Payer: Self-pay | Admitting: Family Medicine

## 2021-02-28 DIAGNOSIS — M25562 Pain in left knee: Secondary | ICD-10-CM | POA: Diagnosis not present

## 2021-02-28 DIAGNOSIS — G8929 Other chronic pain: Secondary | ICD-10-CM

## 2021-02-28 DIAGNOSIS — M25561 Pain in right knee: Secondary | ICD-10-CM | POA: Diagnosis not present

## 2021-02-28 NOTE — Progress Notes (Signed)
Office Visit Note   Patient: Darlene Black           Date of Birth: 27-May-1976           MRN: [id number] Visit Date: 02/28/2021 Requested by: Vicenta Aly, [address] [place],  [place] [address] PCP: Vicenta Aly, FNP  Subjective: Chief Complaint  Patient presents with  . Right Knee - Pain    Pain bilateral knees and inner thighs. Chronic pain. The left knee hurts worse than the right. Has been going to [facility] for her back.   . Left Knee - Pain    HPI: She is here with bilateral knee and thigh pain.  Symptoms have been present for a while now, getting worse.  She is seeing Dr. Jimmye Norman for chiropractic for her back pain but is not being treated for her knees.  She is not taking anything for her pain.              ROS:   All other systems were reviewed and are negative.  Objective: Vital Signs: There were no vitals taken for this visit.  Physical Exam:  General:  Alert and oriented, in no acute distress. Pulm:  Breathing unlabored. Psy:  Normal mood, congruent affect.  Legs: She has good range of motion in the hip joints bilaterally and it does not recreate her current pain.  She is slightly tender over the adductor muscles near the knees.  No knee effusion.  Full range of motion of her knees.  She has bilateral pes planus.  Imaging: No results found.  Assessment & Plan: 1.  Bilateral thigh pain, possibly muscular.  Cannot rule out referred pain from lumbar source. -We will try physical therapy at [facility].  Intramuscular steroid injection given today since this is helped her in the past.     Procedures: No procedures performed        PMFS History: Patient Active Problem List   Diagnosis Date Noted  . Obstructive sleep apnea syndrome, moderate 10/27/2019  . Sjogren's syndrome ([place]) 10/20/2019  . Mild peripheral edema 06/24/2018  . Axillary lymphadenopathy 06/08/2018  . 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  Anemia, unspecified 01/07/2018  . Leukopenia 01/07/2018  . S/P lumbar spinal fusion 12/01/2017  . Paroxysmal atrial fibrillation ([place]) 11/29/2016  . Hypertension 11/29/2016  . Current use of long term anticoagulation 11/29/2016  . Atrial fibrillation with rapid ventricular response ([place]) 11/29/2016  . Obesity (BMI 30-39.9)   . Lumbar disc disease   . Adult situational stress disorder 01/27/2016  . Left lumbar radiculopathy 01/27/2016   Past Medical History:  Diagnosis Date  . Arthritis    lumbar,   . Fibroid   . History of kidney stones    + told that she had calculi, unsure of whether she has passed them    . Hypertension    had been on lisinopril prior to pregnacy 2013-was d/c and has not been restarted on anything  . Lumbar disc disease   . Obesity (BMI 30-39.9)   . Paroxysmal atrial fibrillation ([place]) 11/29/2016   CHA2DS2VASC score 2    onset 11/29/2016  . Seizures ([place])    school age- primary. x1 only , related to febrile episode.     Family History  Problem Relation Age of Onset  . Hypertension Mother   . Heart disease Mother   . Arthritis Mother   . Hyperlipidemia Mother   . Hypertension Maternal Grandmother   . Heart  disease Maternal Grandmother   . Arthritis Maternal Grandmother   . Hypertension Maternal Grandfather   . Heart disease Maternal Grandfather   . Anesthesia problems Neg Hx   . Other Neg Hx     Past Surgical History:  Procedure Laterality Date  . CERVICAL LAMINECTOMY  01/2010   C6 metal bracing   . CESAREAN SECTION  10/10/2012   Procedure: CESAREAN SECTION;x1  Surgeon: Frederico Hamman, MD;  Location: [facility];  Service: Obstetrics;  Laterality: N/A;  . DILITATION & CURRETTAGE/HYSTROSCOPY WITH HYDROTHERMAL ABLATION N/A 10/16/2015   Procedure: DILATATION & CURETTAGE/HYSTEROSCOPY WITH HYDROTHERMAL ABLATION;  Surgeon: Frederico Hamman, MD;  Location: [facility];  Service: Gynecology;  Laterality: N/A;  . LAPAROSCOPIC TUBAL LIGATION  01/18/2013    Procedure: LAPAROSCOPIC TUBAL LIGATION;  Surgeon: Frederico Hamman, MD;  Location: [facility];  Service: Gynecology;  Laterality: Bilateral;  . TUBAL LIGATION     Social History   Occupational History  . Not on file  Tobacco Use  . Smoking status: Former Smoker    Packs/day: 1.00    Years: 20.00    Pack years: 20.00    Types: Cigarettes    Quit date: 12/28/2011    Years since quitting: 9.1  . Smokeless tobacco: Never Used  Substance and Sexual Activity  . Alcohol use: No  . Drug use: No  . Sexual activity: Yes    Birth control/protection: Surgical    Comment: tubal at 6 weeks

## 2021-03-07 ENCOUNTER — Encounter: Payer: Self-pay | Admitting: Family Medicine

## 2021-03-07 ENCOUNTER — Other Ambulatory Visit: Payer: Self-pay

## 2021-03-07 ENCOUNTER — Ambulatory Visit (INDEPENDENT_AMBULATORY_CARE_PROVIDER_SITE_OTHER): Payer: 59 | Admitting: Family Medicine

## 2021-03-07 DIAGNOSIS — R29898 Other symptoms and signs involving the musculoskeletal system: Secondary | ICD-10-CM | POA: Diagnosis not present

## 2021-03-07 DIAGNOSIS — M25369 Other instability, unspecified knee: Secondary | ICD-10-CM

## 2021-03-07 DIAGNOSIS — M25561 Pain in right knee: Secondary | ICD-10-CM

## 2021-03-07 DIAGNOSIS — M25562 Pain in left knee: Secondary | ICD-10-CM

## 2021-03-07 DIAGNOSIS — G8929 Other chronic pain: Secondary | ICD-10-CM

## 2021-03-07 NOTE — Progress Notes (Signed)
Office Visit Note   Patient: Darlene Black           Date of Birth: 1976/05/27           MRN: [id number] Visit Date: 03/07/2021 Requested by: Vicenta Aly, [address] [place],  [place] [address] PCP: Vicenta Aly, FNP  Subjective: Chief Complaint  Patient presents with  . Left Knee - Pain    Left knee has been popping every time she stands up x 3-4 days - hurts when it pops. Pain in the medial aspect.  . Right Knee - Pain    Right knee is popping some. Hurts in the medial area.    HPI: She is here with bilateral knee instability.  For the past several days her knees have been popping frequently, every time she stands up.  They feel unstable.  Left: Is worse, but both are doing it.  Her pain is primarily on the medial aspect.  Hinged knee brace does not stay on well enough to give her support.              ROS:   All other systems were reviewed and are negative.  Objective: Vital Signs: There were no vitals taken for this visit.  Physical Exam:  General:  Alert and oriented, in no acute distress. Pulm:  Breathing unlabored. Psy:  Normal mood, congruent affect.  Knees: She has 1+ effusion on the left, none on the right.  Both knees have 1-2+ patellofemoral crepitus.  There is tenderness on the medial joint line of both knees and slight laxity with valgus stress but solid endpoint.  No detectable ACL laxity.  Imaging: No results found.  Assessment & Plan: 1.  Bilateral knee popping and sensation of instability, suspicious for meniscus tear. -We discussed options, she states that she has done well with surgeries in the past for various things and would like to proceed with x-rays and MRI scan followed by surgical consult if indicated.     Procedures: No procedures performed        PMFS History: Patient Active Problem List   Diagnosis Date Noted  . Obstructive sleep apnea syndrome, moderate 10/27/2019  . Sjogren's syndrome ([place]) 10/20/2019   . Mild peripheral edema 06/24/2018  . Axillary lymphadenopathy 06/08/2018  . 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 Anemia, unspecified 01/07/2018  . Leukopenia 01/07/2018  . S/P lumbar spinal fusion 12/01/2017  . Paroxysmal atrial fibrillation ([place]) 11/29/2016  . Hypertension 11/29/2016  . Current use of long term anticoagulation 11/29/2016  . Atrial fibrillation with rapid ventricular response ([place]) 11/29/2016  . Obesity (BMI 30-39.9)   . Lumbar disc disease   . Adult situational stress disorder 01/27/2016  . Left lumbar radiculopathy 01/27/2016   Past Medical History:  Diagnosis Date  . Arthritis    lumbar,   . Fibroid   . History of kidney stones    + told that she had calculi, unsure of whether she has passed them    . Hypertension    had been on lisinopril prior to pregnacy 2013-was d/c and has not been restarted on anything  . Lumbar disc disease   . Obesity (BMI 30-39.9)   . Paroxysmal atrial fibrillation ([place]) 11/29/2016   CHA2DS2VASC score 2    onset 11/29/2016  . Seizures ([place])    school age- primary. x1 only , related to febrile episode.     Family History  Problem Relation Age of Onset  . Hypertension Mother   .  Heart disease Mother   . Arthritis Mother   . Hyperlipidemia Mother   . Hypertension Maternal Grandmother   . Heart disease Maternal Grandmother   . Arthritis Maternal Grandmother   . Hypertension Maternal Grandfather   . Heart disease Maternal Grandfather   . Anesthesia problems Neg Hx   . Other Neg Hx     Past Surgical History:  Procedure Laterality Date  . CERVICAL LAMINECTOMY  01/2010   C6 metal bracing   . CESAREAN SECTION  10/10/2012   Procedure: CESAREAN SECTION;x1  Surgeon: Frederico Hamman, MD;  Location: [facility];  Service: Obstetrics;  Laterality: N/A;  . DILITATION & CURRETTAGE/HYSTROSCOPY WITH HYDROTHERMAL ABLATION N/A 10/16/2015   Procedure: DILATATION & CURETTAGE/HYSTEROSCOPY WITH HYDROTHERMAL ABLATION;  Surgeon: Frederico Hamman, MD;  Location: [facility];  Service: Gynecology;  Laterality: N/A;  . LAPAROSCOPIC TUBAL LIGATION  01/18/2013   Procedure: LAPAROSCOPIC TUBAL LIGATION;  Surgeon: Frederico Hamman, MD;  Location: [facility];  Service: Gynecology;  Laterality: Bilateral;  . TUBAL LIGATION     Social History   Occupational History  . Not on file  Tobacco Use  . Smoking status: Former Smoker    Packs/day: 1.00    Years: 20.00    Pack years: 20.00    Types: Cigarettes    Quit date: 12/28/2011    Years since quitting: 9.1  . Smokeless tobacco: Never Used  Substance and Sexual Activity  . Alcohol use: No  . Drug use: No  . Sexual activity: Yes    Birth control/protection: Surgical    Comment: tubal at 6 weeks

## 2021-03-10 ENCOUNTER — Telehealth: Payer: Self-pay | Admitting: Family Medicine

## 2021-03-10 NOTE — Telephone Encounter (Signed)
[employer] forms received. Sent to [facility] 

## 2021-03-28 ENCOUNTER — Other Ambulatory Visit: Payer: 59

## 2021-03-30 ENCOUNTER — Ambulatory Visit
Admission: RE | Admit: 2021-03-30 | Discharge: 2021-03-30 | Disposition: A | Discharge status: Home / Self Care | Payer: 59 | Source: Ambulatory Visit | Attending: Family Medicine | Admitting: Family Medicine

## 2021-03-30 ENCOUNTER — Other Ambulatory Visit: Payer: Self-pay

## 2021-03-30 DIAGNOSIS — M25562 Pain in left knee: Secondary | ICD-10-CM

## 2021-03-30 DIAGNOSIS — G8929 Other chronic pain: Secondary | ICD-10-CM

## 2021-03-30 DIAGNOSIS — M25561 Pain in right knee: Secondary | ICD-10-CM

## 2021-03-30 DIAGNOSIS — R29898 Other symptoms and signs involving the musculoskeletal system: Secondary | ICD-10-CM

## 2021-03-30 DIAGNOSIS — M25369 Other instability, unspecified knee: Secondary | ICD-10-CM

## 2021-03-31 ENCOUNTER — Telehealth: Payer: Self-pay | Admitting: Family Medicine

## 2021-03-31 NOTE — Telephone Encounter (Signed)
MRI scans of both knees are notable for arthritic changes.  No cartilage or ligament tears.

## 2021-04-07 ENCOUNTER — Encounter: Payer: Self-pay | Admitting: Family Medicine

## 2021-04-07 ENCOUNTER — Ambulatory Visit (INDEPENDENT_AMBULATORY_CARE_PROVIDER_SITE_OTHER): Payer: 59 | Admitting: Family Medicine

## 2021-04-07 ENCOUNTER — Telehealth: Payer: Self-pay | Admitting: Family Medicine

## 2021-04-07 ENCOUNTER — Other Ambulatory Visit: Payer: Self-pay

## 2021-04-07 DIAGNOSIS — M1711 Unilateral primary osteoarthritis, right knee: Secondary | ICD-10-CM

## 2021-04-07 DIAGNOSIS — M1712 Unilateral primary osteoarthritis, left knee: Secondary | ICD-10-CM

## 2021-04-07 NOTE — Telephone Encounter (Signed)
Noted  

## 2021-04-07 NOTE — Progress Notes (Signed)
Office Visit Note   Patient: Darlene Black           Date of Birth: 1976/01/27           MRN: [id number] Visit Date: 04/07/2021 Requested by: Vicenta Aly, [address] [place],  [place] [address] PCP: Vicenta Aly, FNP  Subjective: Chief Complaint  Patient presents with  . Right Knee - Follow-up    Post bilateral knee MRIs  . Left Knee - Follow-up  . Other    The patient said her podiatrist  Twanna Hy. Ajlouny) wanted her to make Dr. Junius Roads aware that the patient needs to have surgery on both feet - 1st MTP. She wants to coordinate that time out for surgery with whatever treatment she has for the knees.    HPI: She is here to discuss MRI results for bilateral knees.  Pain is a little bit better since she has been out of work.                ROS:   All other systems were reviewed and are negative.  Objective: Vital Signs: There were no vitals taken for this visit.  Physical Exam:  General:  Alert and oriented, in no acute distress. Pulm:  Breathing unlabored. Psy:  Normal mood, congruent affect.  No exam done.  Imaging: Bilateral knee MRI scans show no sign of ligament or cartilage tears, but she does have arthritic changes in both knees.   Assessment & Plan: 1.  Bilateral knee osteoarthritis -We discussed options and she would like to proceed with gel injections.  We will request insurance approval. - Out of work 3 more weeks. - Try glucosamine.  Do SLR.     Procedures: No procedures performed        PMFS History: Patient Active Problem List   Diagnosis Date Noted  . Onychomycosis of toenail 09/03/2020  . Pain in left knee 09/02/2020  . GAD (generalized anxiety disorder) 04/15/2020  . Menopausal symptoms 04/15/2020  . Obstructive sleep apnea syndrome, moderate 10/27/2019  . Sjogren's syndrome ([place]) 10/20/2019  . Mild peripheral edema 06/24/2018  . Axillary lymphadenopathy 06/08/2018  . 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 Anemia, unspecified 01/07/2018  . Leukopenia 01/07/2018  . S/P lumbar spinal fusion 12/01/2017  . Paroxysmal atrial fibrillation ([place]) 11/29/2016  . Hypertension 11/29/2016  . Current use of long term anticoagulation 11/29/2016  . Atrial fibrillation with rapid ventricular response ([place]) 11/29/2016  . Obesity (BMI 30-39.9)   . Lumbar disc disease   . Adult situational stress disorder 01/27/2016  . Left lumbar radiculopathy 01/27/2016   Past Medical History:  Diagnosis Date  . Arthritis    lumbar,   . Fibroid   . History of kidney stones    + told that she had calculi, unsure of whether she has passed them    . Hypertension    had been on lisinopril prior to pregnacy 2013-was d/c and has not been restarted on anything  . Lumbar disc disease   . Obesity (BMI 30-39.9)   . Paroxysmal atrial fibrillation ([place]) 11/29/2016   CHA2DS2VASC score 2    onset 11/29/2016  . Seizures ([place])    school age- primary. x1 only , related to febrile episode.     Family History  Problem Relation Age of Onset  . Hypertension Mother   . Heart disease Mother   . Arthritis Mother   . Hyperlipidemia Mother   . Hypertension Maternal Grandmother   .  Heart disease Maternal Grandmother   . Arthritis Maternal Grandmother   . Hypertension Maternal Grandfather   . Heart disease Maternal Grandfather   . Anesthesia problems Neg Hx   . Other Neg Hx     Past Surgical History:  Procedure Laterality Date  . CERVICAL LAMINECTOMY  01/2010   C6 metal bracing   . CESAREAN SECTION  10/10/2012   Procedure: CESAREAN SECTION;x1  Surgeon: Frederico Hamman, MD;  Location: [facility];  Service: Obstetrics;  Laterality: N/A;  . DILITATION & CURRETTAGE/HYSTROSCOPY WITH HYDROTHERMAL ABLATION N/A 10/16/2015   Procedure: DILATATION & CURETTAGE/HYSTEROSCOPY WITH HYDROTHERMAL ABLATION;  Surgeon: Frederico Hamman, MD;  Location: [facility];  Service: Gynecology;  Laterality: N/A;  . LAPAROSCOPIC TUBAL LIGATION  01/18/2013    Procedure: LAPAROSCOPIC TUBAL LIGATION;  Surgeon: Frederico Hamman, MD;  Location: [facility];  Service: Gynecology;  Laterality: Bilateral;  . TUBAL LIGATION     Social History   Occupational History  . Not on file  Tobacco Use  . Smoking status: Former Smoker    Packs/day: 1.00    Years: 20.00    Pack years: 20.00    Types: Cigarettes    Quit date: 12/28/2011    Years since quitting: 9.2  . Smokeless tobacco: Never Used  Substance and Sexual Activity  . Alcohol use: No  . Drug use: No  . Sexual activity: Yes    Birth control/protection: Surgical    Comment: tubal at 6 weeks

## 2021-04-07 NOTE — Telephone Encounter (Signed)
Requesting approval for gel injections for bilateral knee OA.

## 2021-04-24 ENCOUNTER — Telehealth: Payer: Self-pay

## 2021-04-24 ENCOUNTER — Encounter: Payer: Self-pay | Admitting: Family Medicine

## 2021-04-24 NOTE — Telephone Encounter (Signed)
VOB has been submitted for SynviscOne, bilateral knee. Pending BV.

## 2021-04-25 ENCOUNTER — Encounter: Payer: Self-pay | Admitting: Family Medicine

## 2021-04-28 ENCOUNTER — Telehealth: Payer: Self-pay

## 2021-04-28 NOTE — Telephone Encounter (Signed)
PA required for SynviscOne, bilateral knee. Faxed completed PA form to [facility] at [phone]. Pending PA

## 2021-04-30 ENCOUNTER — Telehealth: Payer: Self-pay | Admitting: Family Medicine

## 2021-04-30 ENCOUNTER — Telehealth: Payer: Self-pay

## 2021-04-30 NOTE — Telephone Encounter (Signed)
[facility] called and has some questions about the request you put in about this pt. CB: [id number] REF# [id number]

## 2021-04-30 NOTE — Telephone Encounter (Signed)
Submitted VOB for Monovisc, bilateral knee due to SynviscOne not being a preferred product. Pending BV

## 2021-04-30 NOTE — Telephone Encounter (Signed)
Talked with Glynis Smiles. at [facility].  Per Glynis Smiles., changed medication to Monovisc and PA has been started for Monovisc, bilateral knee. Pending PA# [id number]

## 2021-05-09 ENCOUNTER — Telehealth: Payer: Self-pay

## 2021-05-09 ENCOUNTER — Encounter: Payer: Self-pay | Admitting: Family Medicine

## 2021-05-09 DIAGNOSIS — M1712 Unilateral primary osteoarthritis, left knee: Secondary | ICD-10-CM

## 2021-05-09 DIAGNOSIS — M1711 Unilateral primary osteoarthritis, right knee: Secondary | ICD-10-CM

## 2021-05-09 NOTE — Telephone Encounter (Signed)
Received denial letter from [facility] for [place], bilateral knee stating that patient did not meet the following criteria such as patient experiencing an inadequate response or intolerance or has a contraindication to a trial of analgesic for at least 3 months. (NSAIDS, topical cream, acetaminophen up to 3 to 4 grams per day)  Please advise on next option for patient.  Thank you.

## 2021-05-09 NOTE — Telephone Encounter (Signed)
Sent her a MyChart asking what meds she's tried.

## 2021-05-09 NOTE — Telephone Encounter (Signed)
Please see April's message and advise.

## 2021-05-13 ENCOUNTER — Encounter: Payer: Self-pay | Admitting: Family Medicine

## 2021-05-13 MED ORDER — MELOXICAM 15 MG PO TABS: 7.5000 mg | ORAL_TABLET | Freq: Every day | ORAL | 6 refills | Status: AC | PRN

## 2021-05-19 ENCOUNTER — Other Ambulatory Visit: Payer: Self-pay

## 2021-05-19 ENCOUNTER — Encounter: Payer: Self-pay | Admitting: Family Medicine

## 2021-05-19 ENCOUNTER — Ambulatory Visit (INDEPENDENT_AMBULATORY_CARE_PROVIDER_SITE_OTHER): Payer: 59 | Admitting: Family Medicine

## 2021-05-19 DIAGNOSIS — M1712 Unilateral primary osteoarthritis, left knee: Secondary | ICD-10-CM

## 2021-05-19 NOTE — Progress Notes (Signed)
Office Visit Note   Patient: Darlene Black           Date of Birth: March 09, 1976           MRN: [id number] Visit Date: 05/19/2021 Requested by: Vicenta Aly, [place] [place],  [place] [address] PCP: Vicenta Aly, FNP  Subjective: Chief Complaint  Patient presents with  . Right Knee - Follow-up, Pain    Gel injections were not approved. Will start PT on 05/26/21 for her knees. Taking meloxicam.  . Left Knee - Follow-up, Pain    HPI: She is here for follow-up left knee pain.  Unfortunately insurance denied gel injections.  She is scheduled to start physical therapy on June 13.  As long as she is not on her feet for long periods of time, her pain is manageable.              ROS:   All other systems were reviewed and are negative.  Objective: Vital Signs: There were no vitals taken for this visit.  Physical Exam:  General:  Alert and oriented, in no acute distress. Pulm:  Breathing unlabored. Psy:  Normal mood, congruent affect.  Left knee: No effusion today.  Tender on the medial and lateral joint lines.  Full extension and flexion of 120 degrees.  Imaging: No results found.  Assessment & Plan: 1.  Persistent left knee pain -Start physical therapy next week.  Out of work until July 15 while doing physical therapy and then we will reassess.     Procedures: No procedures performed        PMFS History: Patient Active Problem List   Diagnosis Date Noted  . Onychomycosis of toenail 09/03/2020  . Pain in left knee 09/02/2020  . GAD (generalized anxiety disorder) 04/15/2020  . Menopausal symptoms 04/15/2020  . Obstructive sleep apnea syndrome, moderate 10/27/2019  . Sjogren's syndrome ([place]) 10/20/2019  . Mild peripheral edema 06/24/2018  . Axillary lymphadenopathy 06/08/2018  . 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 Anemia, unspecified 01/07/2018  . Leukopenia 01/07/2018  . S/P lumbar spinal fusion 12/01/2017  . Paroxysmal atrial  fibrillation ([place]) 11/29/2016  . Hypertension 11/29/2016  . Current use of long term anticoagulation 11/29/2016  . Atrial fibrillation with rapid ventricular response ([place]) 11/29/2016  . Obesity (BMI 30-39.9)   . Lumbar disc disease   . Adult situational stress disorder 01/27/2016  . Left lumbar radiculopathy 01/27/2016   Past Medical History:  Diagnosis Date  . Arthritis    lumbar,   . Fibroid   . History of kidney stones    + told that she had calculi, unsure of whether she has passed them    . Hypertension    had been on lisinopril prior to pregnacy 2013-was d/c and has not been restarted on anything  . Lumbar disc disease   . Obesity (BMI 30-39.9)   . Paroxysmal atrial fibrillation ([place]) 11/29/2016   CHA2DS2VASC score 2    onset 11/29/2016  . Seizures ([place])    school age- primary. x1 only , related to febrile episode.     Family History  Problem Relation Age of Onset  . Hypertension Mother   . Heart disease Mother   . Arthritis Mother   . Hyperlipidemia Mother   . Hypertension Maternal Grandmother   . Heart disease Maternal Grandmother   . Arthritis Maternal Grandmother   . Hypertension Maternal Grandfather   . Heart disease Maternal Grandfather   . Anesthesia problems Neg  Hx   . Other Neg Hx     Past Surgical History:  Procedure Laterality Date  . CERVICAL LAMINECTOMY  01/2010   C6 metal bracing   . CESAREAN SECTION  10/10/2012   Procedure: CESAREAN SECTION;x1  Surgeon: Frederico Hamman, MD;  Location: [facility];  Service: Obstetrics;  Laterality: N/A;  . DILITATION & CURRETTAGE/HYSTROSCOPY WITH HYDROTHERMAL ABLATION N/A 10/16/2015   Procedure: DILATATION & CURETTAGE/HYSTEROSCOPY WITH HYDROTHERMAL ABLATION;  Surgeon: Frederico Hamman, MD;  Location: [facility];  Service: Gynecology;  Laterality: N/A;  . LAPAROSCOPIC TUBAL LIGATION  01/18/2013   Procedure: LAPAROSCOPIC TUBAL LIGATION;  Surgeon: Frederico Hamman, MD;  Location: [facility];  Service: Gynecology;  Laterality:  Bilateral;  . TUBAL LIGATION     Social History   Occupational History  . Not on file  Tobacco Use  . Smoking status: Former Smoker    Packs/day: 1.00    Years: 20.00    Pack years: 20.00    Types: Cigarettes    Quit date: 12/28/2011    Years since quitting: 9.3  . Smokeless tobacco: Never Used  Substance and Sexual Activity  . Alcohol use: No  . Drug use: No  . Sexual activity: Yes    Birth control/protection: Surgical    Comment: tubal at 6 weeks

## 2021-05-26 ENCOUNTER — Ambulatory Visit: Payer: 59 | Admitting: Rehabilitative and Restorative Service Providers"

## 2021-06-06 ENCOUNTER — Ambulatory Visit (INDEPENDENT_AMBULATORY_CARE_PROVIDER_SITE_OTHER): Payer: 59 | Admitting: Rehabilitative and Restorative Service Providers"

## 2021-06-06 ENCOUNTER — Encounter: Payer: Self-pay | Admitting: Rehabilitative and Restorative Service Providers"

## 2021-06-06 ENCOUNTER — Other Ambulatory Visit: Payer: Self-pay

## 2021-06-06 DIAGNOSIS — R600 Localized edema: Secondary | ICD-10-CM

## 2021-06-06 DIAGNOSIS — M25562 Pain in left knee: Secondary | ICD-10-CM

## 2021-06-06 DIAGNOSIS — R262 Difficulty in walking, not elsewhere classified: Secondary | ICD-10-CM

## 2021-06-06 DIAGNOSIS — M6281 Muscle weakness (generalized): Secondary | ICD-10-CM | POA: Diagnosis not present

## 2021-06-06 DIAGNOSIS — G8929 Other chronic pain: Secondary | ICD-10-CM

## 2021-06-06 DIAGNOSIS — M25561 Pain in right knee: Secondary | ICD-10-CM

## 2021-06-06 NOTE — Therapy (Addendum)
[place], [place], [address] Phone: [phone]   Fax:  [phone]  Physical Therapy Evaluation /Discharge  Patient Details  Name: Darlene Black MRN: [id number] Date of Birth: [birth year] Referring Provider (Black): Eunice Blase MD   Encounter Date: 06/06/2021   Black End of Session - 06/06/21 1705     Visit Number 1    Number of Visits 16    Black Start Time 0762    Black Stop Time 1345    Black Time Calculation (min) 40 min    Activity Tolerance Patient tolerated treatment well;Patient limited by fatigue    Behavior During Therapy [facility] for tasks assessed/performed             Past Medical History:  Diagnosis Date   Arthritis    lumbar,    Fibroid    History of kidney stones    + told that she had calculi, unsure of whether she has passed them     Hypertension    had been on lisinopril prior to pregnacy 2013-was d/c and has not been restarted on anything   Lumbar disc disease    Obesity (BMI 30-39.9)    Paroxysmal atrial fibrillation ([place]) 11/29/2016   CHA2DS2VASC score 2    onset 11/29/2016   Seizures [facility])    school age- primary. x1 only , related to febrile episode.     Past Surgical History:  Procedure Laterality Date   CERVICAL LAMINECTOMY  01/2010   C6 metal bracing    CESAREAN SECTION  10/10/2012   Procedure: CESAREAN SECTION;x1  Surgeon: Frederico Hamman, MD;  Location: [facility];  Service: Obstetrics;  Laterality: N/A;   DILITATION & CURRETTAGE/HYSTROSCOPY WITH HYDROTHERMAL ABLATION N/A 10/16/2015   Procedure: DILATATION & CURETTAGE/HYSTEROSCOPY WITH HYDROTHERMAL ABLATION;  Surgeon: Frederico Hamman, MD;  Location: [facility];  Service: Gynecology;  Laterality: N/A;   LAPAROSCOPIC TUBAL LIGATION  01/18/2013   Procedure: LAPAROSCOPIC TUBAL LIGATION;  Surgeon: Frederico Hamman, MD;  Location: [facility];  Service: Gynecology;  Laterality: Bilateral;   TUBAL LIGATION      There were no vitals filed for this  visit.    Subjective Assessment - 06/06/21 1658     Subjective Darlene Black has had B knee pain for years.  It has gotten to the point that she was not able to get things done around the house after a day at work.  Dr. Junius Roads recommended injections that the insurance denied.  She has a referral for a month of physical therapy before following-up with Dr. Junius Roads again.    Pertinent History Lumbar disc disease, OA, HTN, A fib, Obesity, previous lumbar fusion, kidney stones, previous seizures, B big toe surgery scheduled in July    Limitations House hold activities;Lifting;Standing;Walking    How long can you sit comfortably? Limited by her back but she "doesn't sit much"    How long can you stand comfortably? She stands "a lot" but takes frequent breaks    How long can you walk comfortably? Walking is limited to within the house and necessary errands (doctor visits, etc)    Patient Stated Goals Get strong to return to work and hopefully delay or avoid surgery    Currently in Pain? Yes    Pain Score 5     Pain Location Knee    Pain Orientation Left;Right    Pain Descriptors / Indicators Aching;Sore;Constant;Sharp    Pain Type Chronic pain    Pain Radiating Towards NA  Pain Onset More than a month ago    Pain Frequency Constant    Aggravating Factors  Being on her feet uninterrupted (too long)    Pain Relieving Factors Taking frequent breaks to rest    Effect of Pain on Daily Activities Out of work until at least July 15    Multiple Pain Sites No                [facility] Black Assessment - 06/06/21 0001       Assessment   Medical Diagnosis L > R knee OA    Referring Provider (Black) Eunice Blase MD    Onset Date/Surgical Date --   Chronic     Precautions   Precautions None      Restrictions   Weight Bearing Restrictions No      Balance Screen   Has the patient fallen in the past 6 months No    Has the patient had a decrease in activity level because of a fear of falling?  No    Is the  patient reluctant to leave their home because of a fear of falling?  No      Home Ecologist residence    Additional Comments 3 stairs no handrail-is difficult      Prior Function   Level of Independence Independent    Vocation Full time employment    Agricultural consultant    [employer]      Cognition   Overall Cognitive Status Within Functional Limits for tasks assessed      Observation/Other Assessments   Focus on Therapeutic Outcomes (FOTO)  31 (Goal 51)      ROM / Strength   AROM / PROM / Strength AROM;Strength      AROM   Overall AROM  Deficits    AROM Assessment Site Knee    Right/Left Knee Left;Right    Right Knee Extension 0    Right Knee Flexion 135    Left Knee Extension 0    Left Knee Flexion 128      Strength   Overall Strength Deficits    Strength Assessment Site Knee    Right/Left Knee Left;Right    Right Knee Extension 4+/5    Left Knee Extension 3+/5                        Objective measurements completed on examination: See above findings.       [place] Adult Black Treatment/Exercise - 06/06/21 0001       Therapeutic Activites    Therapeutic Activities Other Therapeutic Activities    Other Therapeutic Activities Reviewed exam findings, discussed knee anatomy, treatment plan and reviewed starter HEP      Exercises   Exercises Knee/Hip      Knee/Hip Exercises: Seated   Other Seated Knee/Hip Exercises Seated straight leg raises 3 sets of 5 (toes back, press down and tighten thigh)      Knee/Hip Exercises: Supine   Quad Sets Strengthening;Both;2 sets;10 reps;Limitations    Quad Sets Limitations 5 seconds pillow behind knees                    Black Education - 06/06/21 1704     Education Details Reviewed exam findings, talked about Black goals and expectations, answered questions about knee anatomy and reviewed starter HEP    Person(s) Educated Patient    Methods  Explanation;Demonstration;Tactile cues;Verbal cues;Handout  Comprehension Verbal cues required;Returned demonstration;Need further instruction;Verbalized understanding;Tactile cues required              Black Short Term Goals - 06/06/21 1711       Black SHORT TERM GOAL #1   Title Darlene Black will be independent with her day 1 HEP.    Time 4    Period Weeks    Status New    Target Date 07/04/21               Black Long Term Goals - 06/06/21 1712       Black LONG TERM GOAL #1   Title Darlene Black will improve her FOTO score to 51.    Baseline 31    Time 8    Period Weeks    Status New    Target Date 08/01/21      Black LONG TERM GOAL #2   Title Improve B knee pain to consistently 0-4/10 on the Numeric Pain Rating Scale.    Baseline Can be 7+/10    Time 8    Period Weeks    Status New    Target Date 08/01/21      Black LONG TERM GOAL #3   Title Improve B quadriceps strength as assessed by MMT and functional testing for return to work.    Baseline See objective    Time 8    Period Weeks    Status New    Target Date 08/01/21      Black LONG TERM GOAL #4   Title Darlene Black will be independent with her long-term HEP at [place].    Time 8    Period Weeks    Status New    Target Date 08/01/21                    Plan - 06/06/21 1703     Clinical Impression Statement Darlene Black has a significant family history for arthritis (all uncles and cousins on one side of the family have had or are scheduled for joint replacements).  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  Pain has gotten to the point that she was unable to do her normal household duties and chores after a day or week of work.  She is out of work until at least July 15 and she has a B big toe (1st MTP) joint surgery scheduled in July.  Darlene Black has significant B quadriceps strength impairments and she will benefit from the recommended course of physical therapy.    Personal Factors and Comorbidities  Comorbidity 3+    Comorbidities Lumbar disc disease, OA, HTN, A fib, Obesity, previous lumbar fusion, kidney stones, previous seizures, B big toe surgery scheduled in July    Examination-Activity Limitations Sit;Transfers;Bed Mobility;Sleep;Lift;Bend;Squat;Locomotion Level;Stairs;Carry;Stand    Examination-Participation Restrictions Interpersonal Relationship;Occupation;Yard Work;Cleaning;Community Activity;Shop;Meal Prep    Stability/Clinical Decision Making Stable/Uncomplicated    Clinical Decision Making Low    Rehab Potential Good    Black Frequency 2x / week    Black Duration 8 weeks    Black Treatment/Interventions ADLs/Self Care Home Management;Electrical Stimulation;Cryotherapy;Therapeutic activities;Stair training;Therapeutic exercise;Gait training;Balance training;Neuromuscular re-education;Patient/family education;Manual techniques;Vasopneumatic Device    Black Next Visit Plan Review HEP, recumbent bike or Nu Step, leg press, balance, sit to stand (with UE help), limited range knee extension machine (90-30) with slow eccentrics    Black Home Exercise Plan Access Code: [email or web address]    Consulted and Agree with Plan of Care Patient  Patient will benefit from skilled therapeutic intervention in order to improve the following deficits and impairments:  Abnormal gait, Decreased activity tolerance, Decreased balance, Decreased endurance, Decreased range of motion, Decreased strength, Difficulty walking, Increased edema, Obesity, Pain  Visit Diagnosis: Difficulty walking  Muscle weakness (generalized)  Localized edema  Chronic pain of left knee  Chronic pain of right knee     Problem List Patient Active Problem List   Diagnosis Date Noted   Onychomycosis of toenail 09/03/2020   Pain in left knee 09/02/2020   GAD (generalized anxiety disorder) 04/15/2020   Menopausal symptoms 04/15/2020   Obstructive sleep apnea syndrome, moderate 10/27/2019   Sjogren's syndrome (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   Anemia, unspecified 01/07/2018   Leukopenia 01/07/2018   S/P lumbar spinal fusion 12/01/2017   Paroxysmal atrial fibrillation ([place]) 11/29/2016   Hypertension 11/29/2016   Current use of long term anticoagulation 11/29/2016   Atrial fibrillation with rapid ventricular response ([place]) 11/29/2016   Obesity (BMI 30-39.9)    Lumbar disc disease    Adult situational stress disorder 01/27/2016   Left lumbar radiculopathy 01/27/2016    Darlene Black, Darlene Black 06/06/2021, 5:15 PM  PHYSICAL THERAPY DISCHARGE SUMMARY  Visits from Start of Care: 1  Current functional level related to goals / functional outcomes: See note   Remaining deficits: See note   Education / Equipment: HEP   Patient agrees to discharge. Patient goals were partially met. Patient is being discharged due to not returning since the last visit.  Darlene Black, Black, Darlene Black, Darlene Black, Darlene Black 09/11/21  9:20 AM     [facility] [address] [place], [place], [address] Phone: [phone]   Fax:  [phone]  Name: Darlene Black MRN: [id number] Date of Birth: [birth year]

## 2021-06-06 NOTE — Patient Instructions (Signed)
Access Code: [email or web address] URL: [email or web address] Date: 06/06/2021 Prepared by: Vista Mink  Exercises Seated Passive Cervical Retraction - 2 x daily - 6 x weekly - 1-2 sets - 10-15 reps Seated Assisted Cervical Rotation with Towel - 2 x daily - 6 x weekly - 1 sets - 10 reps - 5 hold Upper Trapezius Stretch - 2 x daily - 6 x weekly - 1 sets - 3 reps - 30 sec hold Shoulder External Rotation and Scapular Retraction - 2 x daily - 6 x weekly - 2 sets - 10-15 reps - 5 hold Tandem Stance - 2 x daily - 6 x weekly - 1 sets - 3 reps - 20 sec hold Single Leg Stance - 2 x daily - 6 x weekly - 1 sets - 5 reps - 10 sec hold

## 2021-06-09 ENCOUNTER — Encounter: Payer: 59 | Admitting: Rehabilitative and Restorative Service Providers"

## 2021-06-10 ENCOUNTER — Encounter: Payer: 59 | Admitting: Rehabilitative and Restorative Service Providers"

## 2021-06-10 ENCOUNTER — Telehealth: Payer: Self-pay | Admitting: Rehabilitative and Restorative Service Providers"

## 2021-06-10 NOTE — Telephone Encounter (Signed)
Called and left message after 15 min no show for today's appointment.  Left reminder about next scheduled appointment and call back number.  Scot Jun, PT, DPT, OCS, ATC 06/10/21  9:48 AM

## 2021-06-30 ENCOUNTER — Encounter: Payer: 59 | Admitting: Rehabilitative and Restorative Service Providers"

## 2021-07-07 ENCOUNTER — Encounter: Payer: 59 | Admitting: Rehabilitative and Restorative Service Providers"

## 2021-07-14 ENCOUNTER — Encounter: Payer: 59 | Admitting: Rehabilitative and Restorative Service Providers"

## 2021-11-21 ENCOUNTER — Ambulatory Visit (INDEPENDENT_AMBULATORY_CARE_PROVIDER_SITE_OTHER): Payer: 59

## 2021-11-21 ENCOUNTER — Other Ambulatory Visit: Payer: Self-pay

## 2021-11-21 ENCOUNTER — Ambulatory Visit
Admission: EM | Admit: 2021-11-21 | Discharge: 2021-11-21 | Disposition: A | Discharge status: Home / Self Care | Payer: 59 | Attending: Physician Assistant | Admitting: Physician Assistant

## 2021-11-21 DIAGNOSIS — W19XXXA Unspecified fall, initial encounter: Secondary | ICD-10-CM | POA: Diagnosis not present

## 2021-11-21 DIAGNOSIS — S99911A Unspecified injury of right ankle, initial encounter: Secondary | ICD-10-CM

## 2021-11-21 DIAGNOSIS — M25571 Pain in right ankle and joints of right foot: Secondary | ICD-10-CM

## 2021-11-21 NOTE — ED Triage Notes (Signed)
Onset this morning of right ankle pain. Pt reports that she stepped down the stairs at her home and felt her ankle buckle before she fell. Has been wearing a brace with some pressure relief. Ambulating with a limp due to pain. Confirms swelling. Denies bruising. No LOC.

## 2021-11-21 NOTE — ED Provider Notes (Signed)
[name]    CSN: [id number] Arrival date & time: 11/21/21  1357      History   Chief Complaint Chief Complaint  Patient presents with   Ankle Pain    right    HPI Darlene Black is a 45 y.o. female.   Patient here today for evaluation of right ankle pain that occurred after she was walking down steps earlier this morning and felt her ankle buckle before she fell.  She reports that she continues to have pain to her right ankle diffusely.  She has not had any numbness or tingling.  She has tried wearing a brace with mild relief.  The history is provided by the patient.  Ankle Pain Associated symptoms: no fever    Past Medical History:  Diagnosis Date   Arthritis    lumbar,    Fibroid    History of kidney stones    + told that she had calculi, unsure of whether she has passed them     Hypertension    had been on lisinopril prior to pregnacy 2013-was d/c and has not been restarted on anything   Lumbar disc disease    Obesity (BMI 30-39.9)    Paroxysmal atrial fibrillation ([place]) 11/29/2016   CHA2DS2VASC score 2    onset 11/29/2016   Seizures [facility])    school age- primary. x1 only , related to febrile episode.     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   Anemia, unspecified 01/07/2018   Leukopenia 01/07/2018   S/P lumbar spinal fusion 12/01/2017   Paroxysmal atrial fibrillation ([place]) 11/29/2016   Hypertension 11/29/2016   Current use of long term anticoagulation 11/29/2016   Atrial fibrillation with rapid ventricular response ([place]) 11/29/2016   Obesity (BMI 30-39.9)    Lumbar disc  disease    Adult situational stress disorder 01/27/2016   Left lumbar radiculopathy 01/27/2016    Past Surgical History:  Procedure Laterality Date   CERVICAL LAMINECTOMY  01/2010   C6 metal bracing    CESAREAN SECTION  10/10/2012   Procedure: CESAREAN SECTION;x1  Surgeon: Frederico Hamman, MD;  Location: [facility];  Service: Obstetrics;  Laterality: N/A;   DILITATION & CURRETTAGE/HYSTROSCOPY WITH HYDROTHERMAL ABLATION N/A 10/16/2015   Procedure: DILATATION & CURETTAGE/HYSTEROSCOPY WITH HYDROTHERMAL ABLATION;  Surgeon: Frederico Hamman, MD;  Location: [facility];  Service: Gynecology;  Laterality: N/A;   LAPAROSCOPIC TUBAL LIGATION  01/18/2013   Procedure: LAPAROSCOPIC TUBAL LIGATION;  Surgeon: Frederico Hamman, MD;  Location: [facility];  Service: Gynecology;  Laterality: Bilateral;   TUBAL LIGATION      OB History     Gravida  2   Para  2   Term  2   Preterm  0   AB  0   Living  2      SAB  0   IAB  0   Ectopic  0   Multiple  0   Live Births  2            Home Medications    Prior to Admission medications   Medication Sig Start Date End Date Taking? Authorizing Provider  CARTIA XT 120 MG 24 hr capsule  05/24/19   [provider]  meloxicam (MOBIC) 15 MG tablet Take 0.5-1 tablets (7.5-15 mg total) by mouth daily as needed for pain. 05/13/21   Hilts, Legrand Como, MD  predniSONE (DELTASONE) 10 MG tablet Take 10 mg by mouth daily. 10/23/19   [provider]    Family History Family History  Problem Relation Age of Onset   Hypertension Mother    Heart disease Mother    Arthritis Mother    Hyperlipidemia Mother    Hypertension Maternal Grandmother    Heart disease Maternal Grandmother    Arthritis Maternal Grandmother    Hypertension Maternal Grandfather    Heart disease Maternal Grandfather    Anesthesia problems Neg Hx    Other Neg Hx     Social History Social History   Tobacco Use   Smoking status: Former    Packs/day: 1.00    Years:  20.00    Pack years: 20.00    Types: Cigarettes    Quit date: 12/28/2011    Years since quitting: 9.9   Smokeless tobacco: Never  Substance Use Topics   Alcohol use: No   Drug use: No     Allergies   Lactose   Review of Systems Review of Systems  Constitutional:  Negative for chills and fever.  Eyes:  Negative for discharge and redness.  Respiratory:  Negative for shortness of breath.   Gastrointestinal:  Negative for abdominal pain, nausea and vomiting.  Genitourinary:  Positive for vaginal bleeding and vaginal discharge.  Musculoskeletal:  Positive for arthralgias and joint swelling.  Neurological:  Negative for numbness.    Physical Exam Triage Vital Signs ED Triage Vitals  Enc Vitals Group     BP 11/21/21 1522 (!) 167/101     Pulse Rate 11/21/21 1522 73     Resp 11/21/21 1522 18     Temp 11/21/21 1522 98.4 F (36.9 C)     Temp Source 11/21/21 1522 Oral     SpO2 11/21/21 1522 100 %     Weight --      Height --      Head Circumference --      Peak Flow --      Pain Score 11/21/21 1527 8     Pain Loc --      Pain Edu? --      Excl. in [place]? --    No data found.  Updated Vital Signs BP (!) 167/101 (BP Location: Left Arm) Comment: Pt can't recall if she took BP meds today  Pulse 73   Temp 98.4 F (36.9 C) (Oral)   Resp 18   SpO2 100%      Physical Exam Vitals and nursing note reviewed.  Constitutional:      General: She is not in acute distress.    Appearance: Normal appearance. She is not ill-appearing.  HENT:     Head: Normocephalic and atraumatic.  Eyes:     Conjunctiva/sclera: Conjunctivae normal.  Cardiovascular:     Rate and Rhythm: Normal rate.  Pulmonary:     Effort: Pulmonary effort is normal.  Musculoskeletal:     Comments: Mild decreased range of motion of right ankle due to pain.  Diffuse swelling of right ankle without erythema or bruising noted.  Neurological:     Mental Status: She is alert.  Psychiatric:        Mood and Affect:  Mood normal.  Behavior: Behavior normal.        Thought Content: Thought content normal.     UC Treatments / Results  Labs (all labs ordered are listed, but only abnormal results are displayed) Labs Reviewed - No data to display  EKG   Radiology DG Ankle Complete Right  Result Date: 11/21/2021 CLINICAL DATA:  A 44 year old female presents for evaluation of ankle pain following fall. EXAM: RIGHT ANKLE - COMPLETE 3+ VIEW COMPARISON:  None FINDINGS: Soft tissue swelling about the RIGHT ankle. No acute bony abnormality. Midfoot degenerative changes and signs of plantar enthesopathy upon the calcaneus. Soft tissue swelling is greatest along the lateral malleolus. IMPRESSION: Soft tissue swelling without acute fracture. Degenerative changes. Electronically Signed   By: Zetta Bills M.D.   On: 11/21/2021 15:48    Procedures Procedures (including critical care time)  Medications Ordered in UC Medications - No data to display  Initial Impression / Assessment and Plan / UC Course  I have reviewed the triage vital signs and the nursing notes.  Pertinent labs & imaging results that were available during my care of the patient were reviewed by me and considered in my medical decision making (see chart for details).  Suspect likely strain.  X-ray without fracture.  Recommended she continue wearing brace if that was helpful use ibuprofen or Tylenol if needed for pain.  Recommend follow-up with Ortho if symptoms persist.  Final Clinical Impressions(s) / UC Diagnoses   Final diagnoses:  Injury of right ankle, initial encounter   Discharge Instructions   None    ED Prescriptions   None    PDMP not reviewed this encounter.   Francene Finders, PA-C 11/22/21 [phone]
